# Patient Record
Sex: Male | Born: 1960 | Hispanic: Refuse to answer | Marital: Married | Smoking: Never smoker
Health system: Southern US, Community
[De-identification: ages and names within clinical notes are randomized; demographics above are authoritative.]

## PROBLEM LIST (undated history)

## (undated) DIAGNOSIS — J9819 Other pulmonary collapse: Secondary | ICD-10-CM

## (undated) DIAGNOSIS — I2699 Other pulmonary embolism without acute cor pulmonale: Secondary | ICD-10-CM

## (undated) DIAGNOSIS — F419 Anxiety disorder, unspecified: Secondary | ICD-10-CM

## (undated) DIAGNOSIS — I82409 Acute embolism and thrombosis of unspecified deep veins of unspecified lower extremity: Secondary | ICD-10-CM

## (undated) DIAGNOSIS — F329 Major depressive disorder, single episode, unspecified: Secondary | ICD-10-CM

## (undated) DIAGNOSIS — I10 Essential (primary) hypertension: Secondary | ICD-10-CM

## (undated) DIAGNOSIS — T148XXA Other injury of unspecified body region, initial encounter: Secondary | ICD-10-CM

## (undated) DIAGNOSIS — F32A Depression, unspecified: Secondary | ICD-10-CM

## (undated) HISTORY — PX: BLADDER SURGERY: SHX569

## (undated) HISTORY — PX: LUNG SURGERY: SHX703

## (undated) HISTORY — PX: COLON SURGERY: SHX602

## (undated) HISTORY — PX: BACK SURGERY: SHX140

---

## 2017-06-09 ENCOUNTER — Other Ambulatory Visit (HOSPITAL_BASED_OUTPATIENT_CLINIC_OR_DEPARTMENT_OTHER): Payer: Self-pay | Admitting: Physician Assistant

## 2017-06-09 DIAGNOSIS — M545 Low back pain: Secondary | ICD-10-CM

## 2017-06-10 ENCOUNTER — Ambulatory Visit (HOSPITAL_BASED_OUTPATIENT_CLINIC_OR_DEPARTMENT_OTHER): Payer: Commercial Managed Care - PPO

## 2017-06-17 ENCOUNTER — Ambulatory Visit (HOSPITAL_BASED_OUTPATIENT_CLINIC_OR_DEPARTMENT_OTHER): Payer: Commercial Managed Care - PPO

## 2017-07-31 ENCOUNTER — Other Ambulatory Visit: Payer: Self-pay | Admitting: Orthopedic Surgery

## 2017-07-31 DIAGNOSIS — M48062 Spinal stenosis, lumbar region with neurogenic claudication: Secondary | ICD-10-CM

## 2017-08-09 ENCOUNTER — Ambulatory Visit
Admission: RE | Admit: 2017-08-09 | Discharge: 2017-08-09 | Disposition: A | Discharge status: Home / Self Care | Payer: Commercial Managed Care - PPO | Source: Ambulatory Visit | Attending: Orthopedic Surgery | Admitting: Orthopedic Surgery

## 2017-08-09 ENCOUNTER — Other Ambulatory Visit: Payer: Self-pay | Admitting: Orthopedic Surgery

## 2017-08-09 ENCOUNTER — Ambulatory Visit
Admission: RE | Admit: 2017-08-09 | Discharge: 2017-08-09 | Disposition: A | Discharge status: Home / Self Care | Payer: Self-pay | Source: Ambulatory Visit | Attending: Orthopedic Surgery | Admitting: Orthopedic Surgery

## 2017-08-09 DIAGNOSIS — M48062 Spinal stenosis, lumbar region with neurogenic claudication: Secondary | ICD-10-CM

## 2017-08-09 MED ORDER — DIAZEPAM 5 MG PO TABS
10.0000 mg | ORAL_TABLET | Freq: Once | ORAL | Status: AC
  Administered 2017-08-09: 10 mg via ORAL

## 2017-08-09 MED ORDER — IOPAMIDOL (ISOVUE-M 200) INJECTION 41%
15.0000 mL | Freq: Once | INTRAMUSCULAR | Status: AC
  Administered 2017-08-09: 15 mL via INTRATHECAL

## 2017-08-09 MED ORDER — MEPERIDINE HCL 100 MG/ML IJ SOLN
75.0000 mg | Freq: Once | INTRAMUSCULAR | Status: AC
  Administered 2017-08-09: 75 mg via INTRAMUSCULAR

## 2017-08-09 MED ORDER — ONDANSETRON HCL 4 MG/2ML IJ SOLN: 4.0000 mg | Freq: Four times a day (QID) | INTRAMUSCULAR | Status: DC | PRN

## 2017-08-09 MED ORDER — ONDANSETRON HCL 4 MG/2ML IJ SOLN
4.0000 mg | Freq: Once | INTRAMUSCULAR | Status: AC
  Administered 2017-08-09: 4 mg via INTRAMUSCULAR

## 2017-08-09 NOTE — Discharge Instructions (Signed)

## 2017-08-30 ENCOUNTER — Other Ambulatory Visit (HOSPITAL_COMMUNITY): Payer: Self-pay | Admitting: *Deleted

## 2017-08-30 NOTE — Patient Instructions (Addendum)
[name] [name]  08/30/2017   Your procedure is scheduled on: Wednesday Dec. 26, 2018   [facility]  En[facility]   [place]  elevators to 3rd floor to  Short Stay Center at 1115  AM.    Call this number if you have problems the morning of surgery [phone]   Remember: ONLY 1 PERSON MAY GO WITH YOU TO SHORT STAY TO GET  READY MORNING OF YOUR SURGERY.   Do not eat food After Midnight.    CLEAR LIQUIDS FROM MIDNIGHT UNTIL 715 AM DAY OF SURGERY THEN NOTHING BY MOUTH (NO GUM, CANDY, OR MINTS) AFTER 715 AM DAY OF SURGERY.      CLEAR LIQUID DIET   Foods Allowed                                                                     Foods Excluded  Coffee and tea, regular and decaf                             liquids that you cannot  Plain Jell-O in any flavor                                             see through such as: Fruit ices (not with fruit pulp)                                     milk, soups, orange juice  Iced Popsicles                                    All solid food Carbonated beverages, regular and diet                                    Cranberry, grape and apple juices Sports drinks like Gatorade Lightly seasoned clear broth or consume(fat free) Sugar, honey syrup  Sample Menu Breakfast                                Lunch                                     Supper Cranberry juice                    Beef broth                            Chicken broth Jell-O  Grape juice                           Apple juice Coffee or tea                        Jell-O                                      Popsicle                                                Coffee or tea                        Coffee or tea   Take these medicines the morning of surgery with A SIP OF WATER: DIAZEPAM (VALIUM), GABAPENTIN (NEURONTIN), AMLODIPINE  (NORVASC), DEXAMETHASONE (DECADRON)              You may not have any metal on  your body including jewelry and piercings               Do not wear lotions, powders, perfumes, or deodorant                         Men may shave face and neck.   Do not bring valuables to the hospital. [facility] IS NOT             RESPONSIBLE   FOR VALUABLES.   Contacts, dentures or bridgework may not be worn into surgery.   Leave suitcase in the car. After surgery it may be brought to your room.              Please read over the following fact sheets you were given: _____________________________________________________________________ [facility] - Preparing for Surgery Before surgery, you can play an important role.  Because skin is not sterile, your skin needs to be as free of germs as possible.  You can reduce the number of germs on your skin by washing with CHG (chlorahexidine gluconate) soap before surgery.  CHG is an antiseptic cleaner which kills germs and bonds with the skin to continue killing germs even after washing. Please DO NOT use if you have an allergy to CHG or antibacterial soaps.  If your skin becomes reddened/irritated stop using the CHG and inform your nurse when you arrive at Short Stay. Do not shave (including legs and underarms) for at least 48 hours prior to the first CHG shower.  You may shave your face/neck.  Please follow these instructions carefully:  1.  Shower with CHG Soap the night before surgery and the  morning of surgery.  2.  If you choose to wash your hair, wash your hair first as usual with your normal  shampoo.  3.  After you shampoo, rinse your hair and body thoroughly to remove the shampoo.                             4.  Use CHG as you would any other liquid soap.  You can apply chg directly to the skin and wash.  Gently with a scrungie  or clean washcloth.  5.  Apply the CHG Soap to your body ONLY FROM THE NECK DOWN.   Do   not use on face/ open                           Wound or open sores. Avoid contact with eyes, ears mouth and   genitals  (private parts).                       Wash face,  Genitals (private parts) with your normal soap.             6.  Wash thoroughly, paying special attention to the area where your    surgery  will be performed.  7.  Thoroughly rinse your body with warm water from the neck down.  8.  DO NOT shower/wash with your normal soap after using and rinsing off the CHG Soap.                9.  Pat yourself dry with a clean towel.            10.  Wear clean pajamas.            11.  Place clean sheets on your bed the night of your first shower and do not  sleep with pets. Day of Surgery : Do not apply any lotions/deodorants the morning of surgery.  Please wear clean clothes to the hospital/surgery center.  FAILURE TO FOLLOW THESE INSTRUCTIONS MAY RESULT IN THE CANCELLATION OF YOUR SURGERY  PATIENT SIGNATURE_________________________________  NURSE SIGNATURE__________________________________  ________________________________________________________________________    [name] MireIncentive Spirometer  An incentive spirometer is a tool that can help keep your lungs clear and active. This tool measures how well you are filling your lungs with each breath. Taking long deep breaths may help reverse or decrease the chance of developing breathing (pulmonary) problems (especially infection) following:  A long period of time when you are unable to move or be active. BEFORE THE PROCEDURE   If the spirometer includes an indicator to show your best effort, your nurse or respiratory therapist will set it to a desired goal.  If possible, sit up straight or lean slightly forward. Try not to slouch.  Hold the incentive spirometer in an upright position. INSTRUCTIONS FOR USE  1. Sit on the edge of your bed if possible, or sit up as far as you can in bed or on a chair. 2. Hold the incentive spirometer in an upright position. 3. Breathe out normally. 4. Place the mouthpiece in your mouth and seal your lips tightly around  it. 5. Breathe in slowly and as deeply as possible, raising the piston or the ball toward the top of the column. 6. Hold your breath for 3-5 seconds or for as long as possible. Allow the piston or ball to fall to the bottom of the column. 7. Remove the mouthpiece from your mouth and breathe out normally. 8. Rest for a few seconds and repeat Steps 1 through 7 at least 10 times every 1-2 hours when you are awake. Take your time and take a few normal breaths between deep breaths. 9. The spirometer may include an indicator to show your best effort. Use the indicator as a goal to work toward during each repetition. 10. After each set of 10 deep breaths, practice coughing to be sure your lungs are clear. If you have an incision (the cut made  at the time of surgery), support your incision when coughing by placing a pillow or rolled up towels firmly against it. Once you are able to get out of bed, walk around indoors and cough well. You may stop using the incentive spirometer when instructed by your caregiver.  RISKS AND COMPLICATIONS  Take your time so you do not get dizzy or light-headed.  If you are in pain, you may need to take or ask for pain medication before doing incentive spirometry. It is harder to take a deep breath if you are having pain. AFTER USE  Rest and breathe slowly and easily.  It can be helpful to keep track of a log of your progress. Your caregiver can provide you with a simple table to help with this. If you are using the spirometer at home, follow these instructions: SEEK MEDICAL CARE IF:   You are having difficultly using the spirometer.  You have trouble using the spirometer as often as instructed.  Your pain medication is not giving enough relief while using the spirometer.  You develop fever of 100.5 F (38.1 C) or higher. SEEK IMMEDIATE MEDICAL CARE IF:   You cough up bloody sputum that had not been present before.  You develop fever of 102 F (38.9 C) or  greater.  You develop worsening pain at or near the incision site. MAKE SURE YOU:   Understand these instructions.  Will watch your condition.  Will get help right away if you are not doing well or get worse. Document Released: 01/09/2007 Document Revised: 11/21/2011 Document Reviewed: 03/12/2007 [facility] Patient Information 2014 [place], [place].   ________________________________________________________________________

## 2017-09-01 ENCOUNTER — Other Ambulatory Visit: Payer: Self-pay

## 2017-09-01 ENCOUNTER — Ambulatory Visit (HOSPITAL_COMMUNITY)
Admission: RE | Admit: 2017-09-01 | Discharge: 2017-09-01 | Disposition: A | Discharge status: Home / Self Care | Payer: Commercial Managed Care - PPO | Source: Ambulatory Visit | Attending: Surgical | Admitting: Surgical

## 2017-09-01 ENCOUNTER — Encounter (HOSPITAL_COMMUNITY): Payer: Self-pay

## 2017-09-01 ENCOUNTER — Encounter (HOSPITAL_COMMUNITY)
Admission: RE | Admit: 2017-09-01 | Discharge: 2017-09-01 | Disposition: A | Discharge status: Home / Self Care | Payer: Commercial Managed Care - PPO | Source: Ambulatory Visit | Attending: Orthopedic Surgery | Admitting: Orthopedic Surgery

## 2017-09-01 DIAGNOSIS — M48061 Spinal stenosis, lumbar region without neurogenic claudication: Secondary | ICD-10-CM | POA: Diagnosis not present

## 2017-09-01 DIAGNOSIS — M545 Low back pain, unspecified: Secondary | ICD-10-CM

## 2017-09-01 DIAGNOSIS — Z01818 Encounter for other preprocedural examination: Secondary | ICD-10-CM | POA: Insufficient documentation

## 2017-09-01 DIAGNOSIS — Z01812 Encounter for preprocedural laboratory examination: Secondary | ICD-10-CM | POA: Diagnosis present

## 2017-09-01 DIAGNOSIS — I517 Cardiomegaly: Secondary | ICD-10-CM | POA: Diagnosis not present

## 2017-09-01 DIAGNOSIS — Z01810 Encounter for preprocedural cardiovascular examination: Secondary | ICD-10-CM | POA: Diagnosis not present

## 2017-09-01 HISTORY — DX: Major depressive disorder, single episode, unspecified: F32.9

## 2017-09-01 HISTORY — DX: Other injury of unspecified body region, initial encounter: T14.8XXA

## 2017-09-01 HISTORY — DX: Other pulmonary collapse: J98.19

## 2017-09-01 HISTORY — DX: Depression, unspecified: F32.A

## 2017-09-01 HISTORY — DX: Acute embolism and thrombosis of unspecified deep veins of unspecified lower extremity: I82.409

## 2017-09-01 HISTORY — DX: Essential (primary) hypertension: I10

## 2017-09-01 HISTORY — DX: Anxiety disorder, unspecified: F41.9

## 2017-09-01 LAB — COMPREHENSIVE METABOLIC PANEL
ALT: 67 U/L — ABNORMAL HIGH (ref 17–63)
AST: 39 U/L (ref 15–41)
Albumin: 3.8 g/dL (ref 3.5–5.0)
Alkaline Phosphatase: 56 U/L (ref 38–126)
Anion gap: 9 (ref 5–15)
BUN: 27 mg/dL — ABNORMAL HIGH (ref 6–20)
CO2: 24 mmol/L (ref 22–32)
Calcium: 9.2 mg/dL (ref 8.9–10.3)
Chloride: 107 mmol/L (ref 101–111)
Creatinine, Ser: 0.97 mg/dL (ref 0.61–1.24)
GFR calc Af Amer: >60 mL/min (ref >60)
GFR calc non Af Amer: >60 mL/min (ref >60)
Glucose, Bld: 104 mg/dL — ABNORMAL HIGH (ref 65–99)
Potassium: 4.3 mmol/L (ref 3.5–5.1)
Sodium: 140 mmol/L (ref 135–145)
Total Bilirubin: 0.8 mg/dL (ref 0.3–1.2)
Total Protein: 7.4 g/dL (ref 6.5–8.1)

## 2017-09-01 LAB — CBC WITH DIFFERENTIAL/PLATELET
Basophils Absolute: 0 10*3/uL (ref 0.0–0.1)
Basophils Relative: 0 %
Eosinophils Absolute: 0 10*3/uL (ref 0.0–0.7)
Eosinophils Relative: 0 %
HCT: 39.2 % (ref 39.0–52.0)
Hemoglobin: 13.4 g/dL (ref 13.0–17.0)
Lymphocytes Relative: 11 %
Lymphs Abs: 0.9 10*3/uL (ref 0.7–4.0)
MCH: 30.1 pg (ref 26.0–34.0)
MCHC: 34.2 g/dL (ref 30.0–36.0)
MCV: 88.1 fL (ref 78.0–100.0)
Monocytes Absolute: 0.4 10*3/uL (ref 0.1–1.0)
Monocytes Relative: 5 %
Neutro Abs: 6.5 10*3/uL (ref 1.7–7.7)
Neutrophils Relative %: 84 %
Platelets: 208 10*3/uL (ref 150–400)
RBC: 4.45 MIL/uL (ref 4.22–5.81)
RDW: 15.6 % — ABNORMAL HIGH (ref 11.5–15.5)
WBC: 7.8 10*3/uL (ref 4.0–10.5)

## 2017-09-01 LAB — APTT: aPTT: 22 seconds — ABNORMAL LOW (ref 24–36)

## 2017-09-01 LAB — PROTIME-INR
INR: 0.98
Prothrombin Time: 12.9 seconds (ref 11.4–15.2)

## 2017-09-01 LAB — SURGICAL PCR SCREEN
MRSA, PCR: NEGATIVE
Staphylococcus aureus: NEGATIVE

## 2017-09-06 ENCOUNTER — Ambulatory Visit (HOSPITAL_COMMUNITY): Payer: Commercial Managed Care - PPO

## 2017-09-06 ENCOUNTER — Ambulatory Visit (HOSPITAL_COMMUNITY): Payer: Commercial Managed Care - PPO | Admitting: Certified Registered Nurse Anesthetist

## 2017-09-06 ENCOUNTER — Observation Stay (HOSPITAL_COMMUNITY)
Admission: RE | Admit: 2017-09-06 | Discharge: 2017-09-07 | Disposition: A | Discharge status: Home / Self Care | Payer: Commercial Managed Care - PPO | Source: Ambulatory Visit | Attending: Orthopedic Surgery | Admitting: Orthopedic Surgery

## 2017-09-06 ENCOUNTER — Encounter (HOSPITAL_COMMUNITY): Payer: Self-pay | Admitting: *Deleted

## 2017-09-06 ENCOUNTER — Other Ambulatory Visit: Payer: Self-pay

## 2017-09-06 ENCOUNTER — Encounter (HOSPITAL_COMMUNITY)
Admission: RE | Disposition: A | Discharge status: Home / Self Care | Payer: Self-pay | Source: Ambulatory Visit | Attending: Orthopedic Surgery

## 2017-09-06 DIAGNOSIS — F329 Major depressive disorder, single episode, unspecified: Secondary | ICD-10-CM | POA: Diagnosis not present

## 2017-09-06 DIAGNOSIS — M48061 Spinal stenosis, lumbar region without neurogenic claudication: Secondary | ICD-10-CM | POA: Diagnosis not present

## 2017-09-06 DIAGNOSIS — F419 Anxiety disorder, unspecified: Secondary | ICD-10-CM | POA: Diagnosis not present

## 2017-09-06 DIAGNOSIS — Z23 Encounter for immunization: Secondary | ICD-10-CM | POA: Insufficient documentation

## 2017-09-06 DIAGNOSIS — M549 Dorsalgia, unspecified: Secondary | ICD-10-CM

## 2017-09-06 DIAGNOSIS — M5126 Other intervertebral disc displacement, lumbar region: Secondary | ICD-10-CM | POA: Diagnosis not present

## 2017-09-06 DIAGNOSIS — I10 Essential (primary) hypertension: Secondary | ICD-10-CM | POA: Insufficient documentation

## 2017-09-06 DIAGNOSIS — G8929 Other chronic pain: Secondary | ICD-10-CM

## 2017-09-06 DIAGNOSIS — Z79899 Other long term (current) drug therapy: Secondary | ICD-10-CM | POA: Insufficient documentation

## 2017-09-06 DIAGNOSIS — Z419 Encounter for procedure for purposes other than remedying health state, unspecified: Secondary | ICD-10-CM

## 2017-09-06 DIAGNOSIS — M961 Postlaminectomy syndrome, not elsewhere classified: Secondary | ICD-10-CM | POA: Diagnosis present

## 2017-09-06 HISTORY — PX: LUMBAR LAMINECTOMY/DECOMPRESSION MICRODISCECTOMY: SHX5026

## 2017-09-06 SURGERY — LUMBAR LAMINECTOMY/DECOMPRESSION MICRODISCECTOMY
Anesthesia: General | Site: Back

## 2017-09-06 MED ORDER — OXYCODONE-ACETAMINOPHEN 5-325 MG PO TABS
2.0000 | ORAL_TABLET | ORAL | Status: DC | PRN
  Administered 2017-09-06 – 2017-09-07 (×3): 2 via ORAL
  Filled 2017-09-06 (×3): qty 2

## 2017-09-06 MED ORDER — ONDANSETRON HCL 4 MG PO TABS: 4.0000 mg | ORAL_TABLET | Freq: Four times a day (QID) | ORAL | Status: DC | PRN

## 2017-09-06 MED ORDER — FENTANYL CITRATE (PF) 250 MCG/5ML IJ SOLN
INTRAMUSCULAR | Status: AC
  Filled 2017-09-06: qty 5

## 2017-09-06 MED ORDER — INFLUENZA VAC SPLIT QUAD 0.5 ML IM SUSY
0.5000 mL | PREFILLED_SYRINGE | INTRAMUSCULAR | Status: AC
  Administered 2017-09-07: 0.5 mL via INTRAMUSCULAR

## 2017-09-06 MED ORDER — ACETAMINOPHEN 325 MG PO TABS: 650.0000 mg | ORAL_TABLET | ORAL | Status: DC | PRN

## 2017-09-06 MED ORDER — METHOCARBAMOL 1000 MG/10ML IJ SOLN
500.0000 mg | Freq: Four times a day (QID) | INTRAVENOUS | Status: DC | PRN
  Filled 2017-09-06: qty 5

## 2017-09-06 MED ORDER — PROPOFOL 10 MG/ML IV BOLUS
INTRAVENOUS | Status: DC | PRN
  Administered 2017-09-06: 150 mg via INTRAVENOUS

## 2017-09-06 MED ORDER — LACTATED RINGERS IV SOLN
INTRAVENOUS | Status: DC
  Administered 2017-09-06: 1000 mL via INTRAVENOUS

## 2017-09-06 MED ORDER — FENTANYL CITRATE (PF) 100 MCG/2ML IJ SOLN
INTRAMUSCULAR | Status: DC | PRN
  Administered 2017-09-06 (×5): 50 ug via INTRAVENOUS

## 2017-09-06 MED ORDER — POLYMYXIN B SULFATE 500000 UNITS IJ SOLR
INTRAMUSCULAR | Status: DC | PRN
  Administered 2017-09-06: 500 mL

## 2017-09-06 MED ORDER — MIDAZOLAM HCL 2 MG/2ML IJ SOLN
INTRAMUSCULAR | Status: AC
  Filled 2017-09-06: qty 2

## 2017-09-06 MED ORDER — FENTANYL CITRATE (PF) 100 MCG/2ML IJ SOLN
25.0000 ug | INTRAMUSCULAR | Status: DC | PRN
  Administered 2017-09-06 (×3): 50 ug via INTRAVENOUS

## 2017-09-06 MED ORDER — DEXAMETHASONE SODIUM PHOSPHATE 10 MG/ML IJ SOLN
INTRAMUSCULAR | Status: AC
  Filled 2017-09-06: qty 1

## 2017-09-06 MED ORDER — MIDAZOLAM HCL 5 MG/5ML IJ SOLN
INTRAMUSCULAR | Status: DC | PRN
  Administered 2017-09-06: 2 mg via INTRAVENOUS

## 2017-09-06 MED ORDER — OXYCODONE HCL 5 MG/5ML PO SOLN
5.0000 mg | Freq: Once | ORAL | Status: DC | PRN
Start: 2017-09-06 — End: 2017-09-06

## 2017-09-06 MED ORDER — CHLORHEXIDINE GLUCONATE 4 % EX LIQD: 60.0000 mL | Freq: Once | CUTANEOUS | Status: DC

## 2017-09-06 MED ORDER — BUPIVACAINE-EPINEPHRINE 0.5% -1:200000 IJ SOLN
INTRAMUSCULAR | Status: DC | PRN
  Administered 2017-09-06: 20 mL

## 2017-09-06 MED ORDER — HEMOSTATIC AGENTS (NO CHARGE) OPTIME
TOPICAL | Status: DC | PRN
  Administered 2017-09-06: 1 via TOPICAL

## 2017-09-06 MED ORDER — ONDANSETRON HCL 4 MG/2ML IJ SOLN
INTRAMUSCULAR | Status: DC | PRN
  Administered 2017-09-06: 4 mg via INTRAVENOUS

## 2017-09-06 MED ORDER — CEFAZOLIN SODIUM-DEXTROSE 2-4 GM/100ML-% IV SOLN
2.0000 g | INTRAVENOUS | Status: AC
  Administered 2017-09-06: 2 g via INTRAVENOUS
  Filled 2017-09-06: qty 100

## 2017-09-06 MED ORDER — BUPIVACAINE LIPOSOME 1.3 % IJ SUSP
INTRAMUSCULAR | Status: DC | PRN
  Administered 2017-09-06: 20 mL

## 2017-09-06 MED ORDER — GABAPENTIN 300 MG PO CAPS
300.0000 mg | ORAL_CAPSULE | Freq: Three times a day (TID) | ORAL | Status: DC
  Administered 2017-09-06: 300 mg via ORAL
  Filled 2017-09-06 (×2): qty 1

## 2017-09-06 MED ORDER — BUPIVACAINE-EPINEPHRINE (PF) 0.5% -1:200000 IJ SOLN
INTRAMUSCULAR | Status: AC
  Filled 2017-09-06: qty 30

## 2017-09-06 MED ORDER — ACETAMINOPHEN 650 MG RE SUPP: 650.0000 mg | RECTAL | Status: DC | PRN

## 2017-09-06 MED ORDER — FLEET ENEMA 7-19 GM/118ML RE ENEM: 1.0000 | ENEMA | Freq: Once | RECTAL | Status: DC | PRN

## 2017-09-06 MED ORDER — OXYCODONE HCL 5 MG PO TABS: 5.0000 mg | ORAL_TABLET | Freq: Once | ORAL | Status: DC | PRN

## 2017-09-06 MED ORDER — FENTANYL CITRATE (PF) 100 MCG/2ML IJ SOLN
INTRAMUSCULAR | Status: AC
  Filled 2017-09-06: qty 2

## 2017-09-06 MED ORDER — LIDOCAINE 2% (20 MG/ML) 5 ML SYRINGE
INTRAMUSCULAR | Status: AC
  Filled 2017-09-06: qty 5

## 2017-09-06 MED ORDER — ONDANSETRON HCL 4 MG/2ML IJ SOLN
INTRAMUSCULAR | Status: AC
  Filled 2017-09-06: qty 2

## 2017-09-06 MED ORDER — AMLODIPINE BESYLATE 10 MG PO TABS
10.0000 mg | ORAL_TABLET | Freq: Every day | ORAL | Status: DC
  Administered 2017-09-07: 10 mg via ORAL
  Filled 2017-09-06: qty 1

## 2017-09-06 MED ORDER — ONDANSETRON HCL 4 MG/2ML IJ SOLN: 4.0000 mg | Freq: Four times a day (QID) | INTRAMUSCULAR | Status: DC | PRN

## 2017-09-06 MED ORDER — ZOLPIDEM TARTRATE 10 MG PO TABS
10.0000 mg | ORAL_TABLET | Freq: Every evening | ORAL | Status: DC | PRN
  Administered 2017-09-06: 10 mg via ORAL
  Filled 2017-09-06: qty 1

## 2017-09-06 MED ORDER — BACITRACIN-NEOMYCIN-POLYMYXIN 400-5-5000 EX OINT
TOPICAL_OINTMENT | CUTANEOUS | Status: AC
  Filled 2017-09-06: qty 1

## 2017-09-06 MED ORDER — THROMBIN (RECOMBINANT) 5000 UNITS EX SOLR
OROMUCOSAL | Status: DC | PRN
  Administered 2017-09-06: 5 mL via TOPICAL

## 2017-09-06 MED ORDER — PROPOFOL 10 MG/ML IV BOLUS
INTRAVENOUS | Status: AC
  Filled 2017-09-06: qty 20

## 2017-09-06 MED ORDER — SUGAMMADEX SODIUM 200 MG/2ML IV SOLN
INTRAVENOUS | Status: AC
  Filled 2017-09-06: qty 2

## 2017-09-06 MED ORDER — HYDROCODONE-ACETAMINOPHEN 5-325 MG PO TABS
1.0000 | ORAL_TABLET | ORAL | Status: DC | PRN
  Administered 2017-09-07: 1 via ORAL
  Filled 2017-09-06: qty 1

## 2017-09-06 MED ORDER — ROCURONIUM BROMIDE 50 MG/5ML IV SOSY
PREFILLED_SYRINGE | INTRAVENOUS | Status: AC
  Filled 2017-09-06: qty 5

## 2017-09-06 MED ORDER — CEFAZOLIN SODIUM-DEXTROSE 1-4 GM/50ML-% IV SOLN
1.0000 g | Freq: Three times a day (TID) | INTRAVENOUS | Status: DC
  Administered 2017-09-06 – 2017-09-07 (×2): 1 g via INTRAVENOUS
  Filled 2017-09-06 (×3): qty 50

## 2017-09-06 MED ORDER — METHOCARBAMOL 500 MG PO TABS
500.0000 mg | ORAL_TABLET | Freq: Four times a day (QID) | ORAL | Status: DC | PRN
  Administered 2017-09-06 – 2017-09-07 (×2): 500 mg via ORAL
  Filled 2017-09-06 (×2): qty 1

## 2017-09-06 MED ORDER — BISACODYL 5 MG PO TBEC: 5.0000 mg | DELAYED_RELEASE_TABLET | Freq: Every day | ORAL | Status: DC | PRN

## 2017-09-06 MED ORDER — BACITRACIN-NEOMYCIN-POLYMYXIN 400-5-5000 EX OINT
TOPICAL_OINTMENT | CUTANEOUS | Status: DC | PRN
  Administered 2017-09-06: 1 via TOPICAL

## 2017-09-06 MED ORDER — MENTHOL 3 MG MT LOZG: 1.0000 | LOZENGE | OROMUCOSAL | Status: DC | PRN

## 2017-09-06 MED ORDER — POLYETHYLENE GLYCOL 3350 17 G PO PACK: 17.0000 g | PACK | Freq: Every day | ORAL | Status: DC | PRN

## 2017-09-06 MED ORDER — PHENOL 1.4 % MT LIQD: 1.0000 | OROMUCOSAL | Status: DC | PRN

## 2017-09-06 MED ORDER — POLYMYXIN B SULFATE 500000 UNITS IJ SOLR
INTRAMUSCULAR | Status: AC
  Filled 2017-09-06: qty 500000

## 2017-09-06 MED ORDER — HYDROMORPHONE HCL 1 MG/ML IJ SOLN
0.5000 mg | INTRAMUSCULAR | Status: DC | PRN
  Administered 2017-09-06 – 2017-09-07 (×3): 0.5 mg via INTRAVENOUS
  Filled 2017-09-06 (×3): qty 0.5

## 2017-09-06 MED ORDER — OXYCODONE-ACETAMINOPHEN 5-325 MG PO TABS: 2.0000 | ORAL_TABLET | ORAL | Status: DC | PRN

## 2017-09-06 MED ORDER — BUPIVACAINE LIPOSOME 1.3 % IJ SUSP
20.0000 mL | Freq: Once | INTRAMUSCULAR | Status: DC
  Filled 2017-09-06: qty 20

## 2017-09-06 MED ORDER — LIDOCAINE 2% (20 MG/ML) 5 ML SYRINGE
INTRAMUSCULAR | Status: DC | PRN
  Administered 2017-09-06: 100 mg via INTRAVENOUS

## 2017-09-06 MED ORDER — DEXAMETHASONE SODIUM PHOSPHATE 4 MG/ML IJ SOLN
INTRAMUSCULAR | Status: DC | PRN
  Administered 2017-09-06: 10 mg via INTRAVENOUS

## 2017-09-06 MED ORDER — LACTATED RINGERS IV SOLN
INTRAVENOUS | Status: DC
  Administered 2017-09-06 (×2): via INTRAVENOUS

## 2017-09-06 MED ORDER — ROCURONIUM BROMIDE 50 MG/5ML IV SOSY
PREFILLED_SYRINGE | INTRAVENOUS | Status: DC | PRN
  Administered 2017-09-06 (×3): 10 mg via INTRAVENOUS
  Administered 2017-09-06: 50 mg via INTRAVENOUS

## 2017-09-06 MED ORDER — SUGAMMADEX SODIUM 200 MG/2ML IV SOLN
INTRAVENOUS | Status: DC | PRN
  Administered 2017-09-06: 200 mg via INTRAVENOUS

## 2017-09-06 MED ORDER — THROMBIN (RECOMBINANT) 5000 UNITS EX SOLR
CUTANEOUS | Status: AC
  Filled 2017-09-06: qty 10000

## 2017-09-06 SURGICAL SUPPLY — 50 items
BAG ZIPLOCK 12X15 (MISCELLANEOUS) IMPLANT
BENZOIN TINCTURE PRP APPL 2/3 (GAUZE/BANDAGES/DRESSINGS) ×3 IMPLANT
CLEANER TIP ELECTROSURG 2X2 (MISCELLANEOUS) ×3 IMPLANT
COVER SURGICAL LIGHT HANDLE (MISCELLANEOUS) ×3 IMPLANT
DRAIN PENROSE 18X1/4 LTX STRL (WOUND CARE) IMPLANT
DRAPE MICROSCOPE LEICA (MISCELLANEOUS) ×3 IMPLANT
DRAPE POUCH INSTRU U-SHP 10X18 (DRAPES) ×3 IMPLANT
DRAPE SHEET LG 3/4 BI-LAMINATE (DRAPES) ×3 IMPLANT
DRAPE SURG 17X11 SM STRL (DRAPES) ×3 IMPLANT
DRSG ADAPTIC 3X8 NADH LF (GAUZE/BANDAGES/DRESSINGS) ×3 IMPLANT
DURAPREP 26ML APPLICATOR (WOUND CARE) ×3 IMPLANT
ELECT BLADE TIP CTD 4 INCH (ELECTRODE) ×3 IMPLANT
ELECT REM PT RETURN 15FT ADLT (MISCELLANEOUS) ×3 IMPLANT
GAUZE SPONGE 4X4 12PLY STRL (GAUZE/BANDAGES/DRESSINGS) ×3 IMPLANT
GLOVE BIOGEL PI IND STRL 6.5 (GLOVE) ×1 IMPLANT
GLOVE BIOGEL PI IND STRL 7.5 (GLOVE) ×2 IMPLANT
GLOVE BIOGEL PI IND STRL 8.5 (GLOVE) ×1 IMPLANT
GLOVE BIOGEL PI INDICATOR 6.5 (GLOVE) ×2
GLOVE BIOGEL PI INDICATOR 7.5 (GLOVE) ×4
GLOVE BIOGEL PI INDICATOR 8.5 (GLOVE) ×2
GLOVE ECLIPSE 8.0 STRL XLNG CF (GLOVE) ×6 IMPLANT
GLOVE SURG SS PI 6.5 STRL IVOR (GLOVE) ×3 IMPLANT
GLOVE SURG SS PI 7.5 STRL IVOR (GLOVE) ×3 IMPLANT
GOWN STRL REUS W/ TWL LRG LVL3 (GOWN DISPOSABLE) ×1 IMPLANT
GOWN STRL REUS W/ TWL XL LVL3 (GOWN DISPOSABLE) ×1 IMPLANT
GOWN STRL REUS W/TWL LRG LVL3 (GOWN DISPOSABLE) ×2
GOWN STRL REUS W/TWL XL LVL3 (GOWN DISPOSABLE) ×5 IMPLANT
HEMOSTAT SPONGE AVITENE ULTRA (HEMOSTASIS) ×3 IMPLANT
KIT BASIN OR (CUSTOM PROCEDURE TRAY) ×3 IMPLANT
KIT POSITIONING SURG ANDREWS (MISCELLANEOUS) ×3 IMPLANT
MANIFOLD NEPTUNE II (INSTRUMENTS) ×3 IMPLANT
MARKER SKIN DUAL TIP RULER LAB (MISCELLANEOUS) ×3 IMPLANT
NEEDLE HYPO 22GX1.5 SAFETY (NEEDLE) ×6 IMPLANT
NEEDLE SPNL 18GX3.5 QUINCKE PK (NEEDLE) ×6 IMPLANT
PACK LAMINECTOMY ORTHO (CUSTOM PROCEDURE TRAY) ×3 IMPLANT
PAD ABD 8X10 STRL (GAUZE/BANDAGES/DRESSINGS) ×6 IMPLANT
PATTIES SURGICAL .5 X.5 (GAUZE/BANDAGES/DRESSINGS) ×3 IMPLANT
PATTIES SURGICAL .75X.75 (GAUZE/BANDAGES/DRESSINGS) ×3 IMPLANT
PATTIES SURGICAL 1X1 (DISPOSABLE) IMPLANT
PIN SAFETY NICK PLATE  2 MED (MISCELLANEOUS)
PIN SAFETY NICK PLATE 2 MED (MISCELLANEOUS) IMPLANT
SPONGE LAP 4X18 X RAY DECT (DISPOSABLE) ×6 IMPLANT
STAPLER VISISTAT 35W (STAPLE) ×3 IMPLANT
SUT VIC AB 1 CT1 27 (SUTURE) ×4
SUT VIC AB 1 CT1 27XBRD ANTBC (SUTURE) ×2 IMPLANT
SUT VIC AB 2-0 CT1 27 (SUTURE) ×4
SUT VIC AB 2-0 CT1 TAPERPNT 27 (SUTURE) ×2 IMPLANT
SYR 20CC LL (SYRINGE) ×6 IMPLANT
TAPE CLOTH SURG 4X10 WHT LF (GAUZE/BANDAGES/DRESSINGS) ×3 IMPLANT
TOWEL OR 17X26 10 PK STRL BLUE (TOWEL DISPOSABLE) ×3 IMPLANT

## 2017-09-06 NOTE — Brief Op Note (Signed)
09/06/2017  3:47 PM  PATIENT:  [name] [name]  56 y.o. male  PRE-OPERATIVE DIAGNOSIS:  Spinal stenosisL-3-L-4 and HNP at same level on the right  POST-OPERATIVE DIAGNOSIS: Same as Pre-Op  PROCEDURE:  Procedure(s): Decompressive lumbar laminiectomy for spinal stenosis at L3-L4, microdiscectomy at L3 and L4 of right, and foraminotomy for L3 and L4 root on right for foraminal stenosis (N/A)  SURGEON:  Surgeon(s) and Role:    Ranee Gosselin* Rykker Coviello, MD - Primary  PHYSICIAN ASSISTANT: Dimitri PedAmber Constable PA  ASSISTANTS:Amber Boiseonstable PA  ANESTHESIA:   general  EBL:  100 mL   BLOOD ADMINISTERED:none  DRAINS: none   LOCAL MEDICATIONS USED:  MARCAINE 20cc of 0.50% with Epinephrine at the start of the case and 20cc of Exparel at the end of the case.    SPECIMEN:  Source of Specimen:  L-3-L-4 on the right.  DISPOSITION OF SPECIMEN:  PATHOLOGY  COUNTS:  YES  TOURNIQUET:  * No tourniquets in log *  DICTATION: .Other Dictation: Dictation Number [id number]  PLAN OF CARE: Admit for overnight observation  PATIENT DISPOSITION:  PACU - hemodynamically stable.   Delay start of Pharmacological VTE agent (>24hrs) due to surgical blood loss or risk of bleeding: yes

## 2017-09-06 NOTE — Progress Notes (Signed)
Patient has not arrived for surgery. Writer called his number. He states he does not need to be here until 1:15PM . RN explains that he signed a pre op instruction to be here at 11:15 AM for 1:15 PM surgery. He realizes his error and will get here as soon as possible. Notified surgery desk.

## 2017-09-06 NOTE — Anesthesia Procedure Notes (Signed)
Procedure Name: Intubation Date/Time: 09/06/2017 1:20 PM Performed by: Deliah Boston, CRNA Pre-anesthesia Checklist: Patient identified, Emergency Drugs available, Suction available and Patient being monitored Patient Re-evaluated:Patient Re-evaluated prior to induction Oxygen Delivery Method: Circle system utilized Preoxygenation: Pre-oxygenation with 100% oxygen Induction Type: IV induction Ventilation: Mask ventilation without difficulty Laryngoscope Size: Mac and 4 Grade View: Grade I Tube type: Oral Tube size: 7.5 mm Number of attempts: 1 Airway Equipment and Method: Stylet Placement Confirmation: ETT inserted through vocal cords under direct vision,  positive ETCO2 and breath sounds checked- equal and bilateral Secured at: 22 cm Tube secured with: Tape Dental Injury: Teeth and Oropharynx as per pre-operative assessment
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
## 2017-09-06 NOTE — Anesthesia Preprocedure Evaluation (Signed)
Anesthesia Evaluation  Patient identified by MRN, date of birth, ID band Patient awake    Reviewed: Allergy & Precautions, NPO status , Patient's Chart, lab work & pertinent test results  History of Anesthesia Complications Negative for: history of anesthetic complications  Airway Mallampati: III  TM Distance: >3 FB Neck ROM: Full    Dental  (+) Poor Dentition, Missing   Pulmonary neg pulmonary ROS,    breath sounds clear to auscultation       Cardiovascular hypertension, Pt. on medications  Rhythm:Regular     Neuro/Psych PSYCHIATRIC DISORDERS Anxiety Depression  Neuromuscular disease    GI/Hepatic negative GI ROS, Neg liver ROS,   Endo/Other  negative endocrine ROS  Renal/GU negative Renal ROS     Musculoskeletal   Abdominal   Peds  Hematology negative hematology ROS (+)   Anesthesia Other Findings   Reproductive/Obstetrics                             Anesthesia Physical Anesthesia Plan  ASA: II  Anesthesia Plan: General   Post-op Pain Management:    Induction: Intravenous  PONV Risk Score and Plan: 2 and Ondansetron and Dexamethasone  Airway Management Planned: Oral ETT  Additional Equipment: None  Intra-op Plan:   Post-operative Plan: Extubation in OR  Informed Consent: I have reviewed the patients History and Physical, chart, labs and discussed the procedure including the risks, benefits and alternatives for the proposed anesthesia with the patient or authorized representative who has indicated his/her understanding and acceptance.   Dental advisory given  Plan Discussed with: CRNA and Surgeon  Anesthesia Plan Comments:         Anesthesia Quick Evaluation

## 2017-09-06 NOTE — Transfer of Care (Signed)
Immediate Anesthesia Transfer of Care Note  Patient: [name] [name]  Procedure(s) Performed: Decompressive lumbar laminiectomy for spinal stenosis at L3-L4, microdiscectomy at L3 and L4 of right, and foraminotomy for L3 and L4 root on right for foraminal stenosis (N/A Back)  Patient Location: PACU  Anesthesia Type:General  Level of Consciousness: sedated and responds to stimulation  Airway & Oxygen Therapy: Patient Spontanous Breathing and Patient connected to face mask oxygen  Post-op Assessment: Report given to RN and Post -op Vital signs reviewed and stable  Post vital signs: Reviewed and stable  Last Vitals:  Vitals:   09/06/17 1228 09/06/17 1547  BP: (!) 164/96 (!) 153/94  Pulse: 72 78  Resp: 18   Temp: 36.9 C   SpO2: 96% 96%    Last Pain:  Vitals:   09/06/17 1228  TempSrc: Oral  PainSc: 8          Complications: No apparent anesthesia complications

## 2017-09-06 NOTE — Interval H&P Note (Signed)
History and Physical Interval Note:  09/06/2017 12:55 PM  [name] [name]  has presented today for surgery, with the diagnosis of Spinal stenosis L4-L4  The various methods of treatment have been discussed with the patient and family. After consideration of risks, benefits and other options for treatment, the patient has consented to  Procedure(s): Decompressive lumbar laminiectomy at L3-L4-note labeling (N/A) as a surgical intervention .  The patient's history has been reviewed, patient examined, no change in status, stable for surgery.  I have reviewed the patient's chart and labs.  Questions were answered to the patient's satisfaction.     Ranee Gosselinonald Lenise Jr

## 2017-09-07 ENCOUNTER — Encounter (HOSPITAL_COMMUNITY): Payer: Self-pay | Admitting: Orthopedic Surgery

## 2017-09-07 DIAGNOSIS — M48061 Spinal stenosis, lumbar region without neurogenic claudication: Secondary | ICD-10-CM | POA: Diagnosis not present

## 2017-09-07 MED ORDER — METHOCARBAMOL 500 MG PO TABS: 500.0000 mg | ORAL_TABLET | Freq: Four times a day (QID) | ORAL | 1 refills | Status: DC | PRN

## 2017-09-07 MED ORDER — ZOLPIDEM TARTRATE 10 MG PO TABS: 10.0000 mg | ORAL_TABLET | Freq: Every evening | ORAL | 0 refills | Status: AC | PRN

## 2017-09-07 MED ORDER — OXYCODONE-ACETAMINOPHEN 5-325 MG PO TABS
1.0000 | ORAL_TABLET | ORAL | 0 refills | Status: AC | PRN
Start: 2017-09-07 — End: ?

## 2017-09-07 NOTE — Discharge Instructions (Signed)
For the first three days, remove your dressing, and tape a piece of saran wrap over your incision. °Take your shower, then remove the saran wrap and put a clean dressing on. °After three days you can shower without the saran wrap.  °No lifting or excessive bending °No driving while taking pain medications °Call Dr. Gioffre if any wound complications or temperature of 101 degrees F or over.  °Call the office for an appointment to see Dr. Gioffre in two weeks: [phone] and ask for Dr. Gioffre's medical assistant, Tammy Johnson.  °

## 2017-09-07 NOTE — Evaluation (Signed)
Physical Therapy Evaluation Patient Details Name: [name] HurstWillie J [name] MRN: [id number] DOB: Jul 22, 1961 Today's Date: 09/07/2017   History of Present Illness  s/p decompressive lumbar laminectomy L3-L4, microdiscectomy L3 and 4, foraminotomy L3-L4 root  Clinical Impression  Pt s/p back surgery and presents with functional mobility limitations 2* post op pain and back precautions.  Pt currently mobilizing at sup - MOD I level and planning dc home with family assist.    Follow Up Recommendations No PT follow up    Equipment Recommendations  Rolling walker with 5" wheels    Recommendations for Other Services       Precautions / Restrictions Precautions Precautions: Back Precaution Booklet Issued: Yes (comment) Restrictions Weight Bearing Restrictions: No      Mobility  Bed Mobility Overal bed mobility: Needs Assistance Bed Mobility: Supine to Sit;Sit to Supine     Supine to sit: Supervision Sit to supine: Supervision   General bed mobility comments: Clarified log roll technique and pt demonstrated ability to perform log roll technique x 2  Transfers Overall transfer level: Modified independent Equipment used: None             General transfer comment: cues for technique to avoid bending  Ambulation/Gait Ambulation/Gait assistance: Modified independent (Device/Increase time);Min guard Ambulation Distance (Feet): 500 Feet Assistive device: Rolling walker (2 wheeled) Gait Pattern/deviations: Step-through pattern;Decreased step length - right;Decreased step length - left;Shuffle Gait velocity: decr Gait velocity interpretation: Below normal speed for age/gender General Gait Details: cues for posture and position from RW  Stairs Stairs: Yes Stairs assistance: Min guard Stair Management: Two rails;Step to pattern;Forwards Number of Stairs: 3    Wheelchair Mobility    Modified Rankin (Stroke Patients Only)       Balance Overall balance assessment: Needs  assistance Sitting-balance support: Feet supported;No upper extremity supported Sitting balance-Leahy Scale: Good     Standing balance support: No upper extremity supported Standing balance-Leahy Scale: Fair Standing balance comment: Pt very tentative to move without UE support                             Pertinent Vitals/Pain Pain Assessment: 0-10 Pain Score: 6  Pain Location: back incision Pain Descriptors / Indicators: Aching;Sore Pain Intervention(s): Limited activity within patient's tolerance;Monitored during session;Premedicated before session    Home Living Family/patient expects to be discharged to:: Private residence Living Arrangements: Spouse/significant other Available Help at Discharge: Family;Available 24 hours/day Type of Home: House Home Access: Ramped entrance     Home Layout: One level Home Equipment: Grab bars - tub/shower      Prior Function Level of Independence: Independent         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, Theatre stage managerdrives     Hand Dominance   Dominant Hand: Right    Extremity/Trunk Assessment   Upper Extremity Assessment Upper Extremity Assessment: Overall WFL for tasks assessed    Lower Extremity Assessment Lower Extremity Assessment: Overall WFL for tasks assessed    Cervical / Trunk Assessment Cervical / Trunk Assessment: Other exceptions Cervical / Trunk Exceptions: s/p back sx  Communication   Communication: No difficulties  Cognition Arousal/Alertness: Awake/alert Behavior During Therapy: WFL for tasks assessed/performed Overall Cognitive Status: Within Functional Limits for tasks assessed                                        General  Comments      Exercises     Assessment/Plan    PT Assessment Patient needs continued PT services  PT Problem List Decreased activity tolerance;Decreased balance;Decreased mobility;Decreased knowledge of use of DME;Decreased knowledge of precautions;Pain        PT Treatment Interventions      PT Goals (Current goals can be found in the Care Plan section)  Acute Rehab PT Goals Patient Stated Goal: Regain IND and get back to working on his cars PT Goal Formulation: All assessment and education complete, DC therapy    Frequency Min 1X/week   Barriers to discharge        Co-evaluation               AM-PAC PT "6 Clicks" Daily Activity  Outcome Measure Difficulty turning over in bed (including adjusting bedclothes, sheets and blankets)?: A Little Difficulty moving from lying on back to sitting on the side of the bed? : A Little Difficulty sitting down on and standing up from a chair with arms (e.g., wheelchair, bedside commode, etc,.)?: A Little Help needed moving to and from a bed to chair (including a wheelchair)?: None Help needed walking in hospital room?: None Help needed climbing 3-5 steps with a railing? : A Little 6 Click Score: 20    End of Session   Activity Tolerance: Patient tolerated treatment well Patient left: in bed;with call bell/phone within reach Nurse Communication: Mobility status PT Visit Diagnosis: Difficulty in walking, not elsewhere classified (R26.2)    Time: [id number] PT Time Calculation (min) (ACUTE ONLY): 22 min   Charges:   PT Evaluation $PT Eval Low Complexity: 1 Low     PT G Codes:        Pg [phone]   [name] [name] 09/07/2017, 10:49 AM

## 2017-09-07 NOTE — Progress Notes (Signed)
Subjective: 1 Day Post-Op Procedure(s) (LRB): Decompressive lumbar laminiectomy for spinal stenosis at L3-L4, microdiscectomy at L3 and L4 of right, and foraminotomy for L3 and L4 root on right for foraminal stenosis (N/A) Patient reports pain as 1 on 0-10 scale.  Doing very well. Now has normal Strength in both feet.Will DC today.  Objective: Vital signs in last 24 hours: Temp:  [97.9 F (36.6 C)-98.6 F (37 C)] 98.3 F (36.8 C) (12/27 0620) Pulse Rate:  [65-81] 67 (12/27 0620) Resp:  [11-18] 14 (12/27 0620) BP: (146-172)/(74-105) 172/102 (12/27 0620) SpO2:  [93 %-99 %] 96 % (12/27 0620) Weight:  [105.2 kg (232 lb)] 105.2 kg (232 lb) (12/26 1200)  Intake/Output from previous day: 12/26 0701 - 12/27 0700 In: 3200 [P.O.:600; I.V.:2500; IV Piggyback:100] Out: 2200 [Urine:2100; Blood:100] Intake/Output this shift: No intake/output data recorded.  No results for input(s): HGB in the last 72 hours. No results for input(s): WBC, RBC, HCT, PLT in the last 72 hours. No results for input(s): NA, K, CL, CO2, BUN, CREATININE, GLUCOSE, CALCIUM in the last 72 hours. No results for input(s): LABPT, INR in the last 72 hours.  Dorsiflexion/Plantar flexion intact  Assessment/Plan: 1 Day Post-Op Procedure(s) (LRB): Decompressive lumbar laminiectomy for spinal stenosis at L3-L4, microdiscectomy at L3 and L4 of right, and foraminotomy for L3 and L4 root on right for foraminal stenosis (N/A) Up with therapy.Will DC today.  Ranee Gosselinonald Javonne Louissaint 09/07/2017, 7:30 AM

## 2017-09-07 NOTE — Op Note (Signed)
[name] Salon:  [name], [name]              ACCOUNT NO.:  [id number]  MEDICAL RECORD NO.:  [id number]  LOCATION:                                 FACILITY:  PHYSICIAN:  Georges Lynchonald A. [place], M.D.DATE OF BIRTH:  03/13/1961  DATE OF PROCEDURE:  09/06/2017 DATE OF DISCHARGE:                              OPERATIVE REPORT   SURGEON:  Georges Lynchonald A. Darrelyn HillockGioffre, M.D.  OPERATIVE ASSISTANT:  Dimitri PedAmber Constable, PA  PREOPERATIVE DIAGNOSES: 1. Spinal stenosis with a severe constriction at L3-L4. 2. Herniated disk, L3-4, on the right. 3. Foraminal stenosis involving the L3 root on the right. 4. Foraminal stenosis involving the L4 root on the right.  POSTOPERATIVE DIAGNOSES: 1. Spinal stenosis with a severe constriction at L3-L4. 2. Herniated disk, L3-4, on the right. 3. Foraminal stenosis involving the L3 root on the right. 4. Foraminal stenosis involving the L4 root on the right.  OPERATIONS: 1. Central decompressive lumbar laminectomy for spinal stenosis at L3-     L4. 2. Microdiskectomy for herniated disk at L3-L4 on the right. 3. Foraminotomy for the L3 root on the right. 4. Foraminotomy for the L4 root on the right.  DESCRIPTION OF PROCEDURE:  Under general anesthesia, with the patient on a spinal frame, a routine orthopedic prep and draping of the lower back was carried out.  The appropriate time-out was carried out.  He had 2 g of IV Ancef.  At this time, 2 needles were placed in the back for localization purposes and an x-ray was taken.  Following this, an incision was made over the L3-4 interspace and extended proximally and distally.  The self-retaining retractors were inserted.  I then stripped the muscle from the lamina and spinous process bilaterally at L3-4.  I then placed a Kocher clamp on the spinous process of L3 to re-identify the space.  Once this was done, I then went down centrally and did a decompressive lumbar laminectomy in the usual fashion.  We experienced a great deal of hard  bone and a very tight canal at this point.  We took a great deal of time, brought the microscope in, protected the dura and did a nice dissection by going out and decompressing both lateral recesses.  I did nice foraminotomies for the L3 and the L4 root on the right.  We gently peeled the ligamentum flavum off the dura by protecting the dura with cottonoids.  We went far out laterally on the right.  Another x-ray was taken to make sure I was at the right space, we were.  I then placed the needle in the disk space just to make sure we were in the disk.  At this time, I protected the dura in the usual fashion.  Cruciate incision was made in the posterior longitudinal ligament and the diskectomy was carried out.  I went out wide into the foramen and decompressed the disk as well since the component of disk was in the foramen as well.  After this was done, I did a foraminotomy and I was able to easily pass a hockey-stick out the foramen for the L3 root.  I went down distally and did the same, did a nice foraminotomy for  the L4 root, and I was able to easily pass a hockey-stick out the foramen.  I was now able to easily remove the dura since we did a nice wide decompression.  I thoroughly irrigated out the area and loosely applied some thrombin-soaked Gelfoam.  I closed the wound in layers in usual fashion except I left a small distal and proximal deep part of the wound open for drainage purposes.  Subcu was closed with 2-0 Vicryl. Skin was closed with metal staples.  Sterile dressing was applied. Note, at the beginning of the procedure, I injected 20 mL of 0.5% Marcaine with epinephrine to control bleeding.  At the end of the case, I injected 20 mL of Exparel for pain control.          ______________________________ Georges Lynchonald A. Darrelyn HillockGioffre, M.D.     RAG/MEDQ  D:  09/06/2017  T:  09/07/2017  Job:  [id number]

## 2017-09-07 NOTE — Progress Notes (Addendum)
Discharge planning, no HH needs identified. Needs RW per PT, contacted [facility] to deliver to room. [phone]

## 2017-09-07 NOTE — Progress Notes (Signed)
Assessment unchanged. Pt verbalized understanding of dc instructions through teach back including when to call doctor, follow up care, and activity to resume. Scripts x 3 given as provided by MD. Dressing changed prior to dc home with gauze. Pt's car is in parking lot and wife home sick. Pt aware he is not permitted nor safe to drive yet therefore he called [employer] for transport home. Discharged via wc to front entrance to meet [employer] accompanied by NT.

## 2017-09-07 NOTE — Progress Notes (Signed)
Patient is ambulating for the second time and tolerates very well, patient is medicated with Percocet 2 tabs, he had Ambien 10 mg po earlier tonight, patient is asking for "Morphine drips" at present time, he does not appear to be in any distress, has been medicated with Percocet twice, Dilaudid twice and Robaxin once, and Ambien 10 mg po but stated that he is not sleeping.

## 2017-09-07 NOTE — Evaluation (Signed)
Occupational Therapy Evaluation and Discharge Patient Details Name: [name] [name] MRN: [id number] DOB: [birth year] Today's Date: 09/07/2017    History of Present Illness s/p decompressive lumbar laminectomy L3-L4, microdiscectomy L3 and 4, foraminotomy L3-L4 root   Clinical Impression   Pt educated in back precautions at length and reinforced with handout. Pt needing cues during sit to stand to avoid excessive bending. Overall performing ADL and ADL transfers modified independently. Recommending long handled bath sponge. No further OT needs.    Follow Up Recommendations  No OT follow up    Equipment Recommendations  None recommended by OT    Recommendations for Other Services       Precautions / Restrictions Precautions Precautions: Back Precaution Booklet Issued: Yes (comment) Restrictions Weight Bearing Restrictions: No      Mobility Bed Mobility Overal bed mobility: Modified Independent             General bed mobility comments: educated and demonstrated ability to perform log roll technique x 2  Transfers Overall transfer level: Modified independent Equipment used: None             General transfer comment: cues for technique to avoid bending    Balance                                           ADL either performed or assessed with clinical judgement   ADL Overall ADL's : Modified independent                                       General ADL Comments: Educated in back precautions related to ADL and IADL.     Vision Baseline Vision/History: Wears glasses Wears Glasses: At all times Patient Visual Report: No change from baseline       Perception     Praxis      Pertinent Vitals/Pain Pain Assessment: 0-10 Pain Score: 6  Pain Location: back incision Pain Descriptors / Indicators: (like a cut) Pain Intervention(s): Monitored during session;Premedicated before session;Repositioned     Hand Dominance  Right   Extremity/Trunk Assessment Upper Extremity Assessment Upper Extremity Assessment: Overall WFL for tasks assessed   Lower Extremity Assessment Lower Extremity Assessment: Defer to PT evaluation   Cervical / Trunk Assessment Cervical / Trunk Assessment: Other exceptions Cervical / Trunk Exceptions: s/p back sx   Communication Communication Communication: No difficulties   Cognition Arousal/Alertness: Awake/alert Behavior During Therapy: WFL for tasks assessed/performed Overall Cognitive Status: Within Functional Limits for tasks assessed                                     General Comments       Exercises     Shoulder Instructions      Home Living Family/patient expects to be discharged to:: Private residence Living Arrangements: Spouse/significant other Available Help at Discharge: Family;Available 24 hours/day Type of Home: House Home Access: Ramped entrance     Home Layout: One level     Bathroom Shower/Tub: Chief Strategy OfficerTub/shower unit   Bathroom Toilet: Handicapped height     Home Equipment: Grab bars - tub/shower          Prior Functioning/Environment Level of Independence: Independent  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, Nurse, adultdrives        OT Problem List:        OT Treatment/Interventions:      OT Goals(Current goals can be found in the care plan section)    OT Frequency:     Barriers to D/C:            Co-evaluation              AM-PAC PT "6 Clicks" Daily Activity     Outcome Measure Help from another person eating meals?: None Help from another person taking care of personal grooming?: None Help from another person toileting, which includes using toliet, bedpan, or urinal?: None Help from another person bathing (including washing, rinsing, drying)?: None Help from another person to put on and taking off regular upper body clothing?: None Help from another person to put on and taking off regular lower body clothing?:  None 6 Click Score: 24   End of Session Equipment Utilized During Treatment: Gait belt  Activity Tolerance: Patient tolerated treatment well Patient left: in bed;with call bell/phone within reach  OT Visit Diagnosis: Pain                Time: 0752-0821 OT Time Calculation (min): 29 min Charges:  OT General Charges $OT Visit: 1 Visit OT Evaluation $OT Eval Low Complexity: 1 Low OT Treatments $Self Care/Home Management : 8-22 mins G-Codes: OT G-codes **NOT FOR INPATIENT CLASS** Functional Assessment Tool Used: Clinical judgement Functional Limitation: Self care Self Care Current Status ([id number](G8987): At least 1 percent but less than 20 percent impaired, limited or restricted Self Care Goal Status (P2951(G8988): At least 1 percent but less than 20 percent impaired, limited or restricted Self Care Discharge Status [phone]([id number]): At least 1 percent but less than 20 percent impaired, limited or restricted   09/07/2017 Martie RoundJulie Jalaysia Lobb, OTR/L Pager: [phone]  Evern BioMayberry, Meoshia Billing Lynn 09/07/2017, 8:26 AM

## 2017-09-07 NOTE — Progress Notes (Signed)
   09/07/17 1000  PT Time Calculation  PT Start Time (ACUTE ONLY) 0902  PT Stop Time (ACUTE ONLY) 0924  PT Time Calculation (min) (ACUTE ONLY) 22 min  PT G-Codes **NOT FOR INPATIENT CLASS**  Functional Assessment Tool Used Clinical judgement  Functional Limitation Mobility: Walking and moving around  Mobility: Walking and Moving Around Current Status (Z6109(G8978) CJ  Mobility: Walking and Moving Around Goal Status (U0454(G8979) CJ  Mobility: Walking and Moving Around Discharge Status (U9811(G8980) CJ  PT General Charges  $$ ACUTE PT VISIT 1 Visit  PT Evaluation  $PT Eval Low Complexity 1 Low

## 2017-09-09 ENCOUNTER — Encounter (HOSPITAL_COMMUNITY): Payer: Self-pay | Admitting: Orthopedic Surgery

## 2017-09-09 NOTE — Anesthesia Postprocedure Evaluation (Signed)
Anesthesia Post Note  Patient: [name] [name]  Procedure(s) Performed: Decompressive lumbar laminiectomy for spinal stenosis at L3-L4, microdiscectomy at L3 and L4 of right, and foraminotomy for L3 and L4 root on right for foraminal stenosis (N/A Back)     Patient location during evaluation: PACU Anesthesia Type: General Level of consciousness: awake and alert Pain management: pain level controlled Vital Signs Assessment: post-procedure vital signs reviewed and stable Respiratory status: spontaneous breathing, nonlabored ventilation, respiratory function stable and patient connected to nasal cannula oxygen Cardiovascular status: blood pressure returned to baseline and stable Postop Assessment: no apparent nausea or vomiting Anesthetic complications: no    Last Vitals:  Vitals:   09/07/17 0620 09/07/17 0846  BP: (!) 172/102 (!) 151/98  Pulse: 67 79  Resp: 14 16  Temp: 36.8 C 36.9 C  SpO2: 96% 94%    Last Pain:  Vitals:   09/07/17 0846  TempSrc: Oral  PainSc:                  Gladyce Mcray
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
## 2017-09-17 DIAGNOSIS — I2692 Saddle embolus of pulmonary artery without acute cor pulmonale: Secondary | ICD-10-CM | POA: Insufficient documentation

## 2017-09-17 DIAGNOSIS — I10 Essential (primary) hypertension: Secondary | ICD-10-CM | POA: Insufficient documentation

## 2017-12-07 DIAGNOSIS — M47816 Spondylosis without myelopathy or radiculopathy, lumbar region: Secondary | ICD-10-CM | POA: Insufficient documentation

## 2017-12-21 DIAGNOSIS — M5136 Other intervertebral disc degeneration, lumbar region: Secondary | ICD-10-CM | POA: Insufficient documentation

## 2018-01-08 ENCOUNTER — Other Ambulatory Visit: Payer: Self-pay | Admitting: Neurological Surgery

## 2018-01-10 ENCOUNTER — Other Ambulatory Visit (HOSPITAL_COMMUNITY): Payer: Self-pay | Admitting: Neurosurgery

## 2018-01-10 ENCOUNTER — Other Ambulatory Visit: Payer: Self-pay | Admitting: Neurosurgery

## 2018-01-10 DIAGNOSIS — M961 Postlaminectomy syndrome, not elsewhere classified: Secondary | ICD-10-CM

## 2018-01-10 DIAGNOSIS — Z86711 Personal history of pulmonary embolism: Secondary | ICD-10-CM

## 2018-01-17 ENCOUNTER — Other Ambulatory Visit: Payer: Self-pay | Admitting: Radiology

## 2018-01-17 NOTE — Pre-Procedure Instructions (Signed)
Verlon Pischke [place]  01/17/2018      [facility] - [place], [place] - [address] AT [facility] [address] [place] [address] Phone: [phone] Fax: [phone]    Your procedure is scheduled on Jan 26, 2018.  Report to [facility] Admitting at 1020 AM.  Call this number if you have problems the morning of surgery:  [phone]   Continue all medications as directed by your physician except follow these medication instructions before surgery below   Remember:  Do not eat food or drink liquids after midnight.  Take these medicines the morning of surgery with A SIP OF WATER  Amlodipine (norvasc) Oxycodone-acetaminophen (percocet)-if needed for pain  Follow you doctor's instructions on when to begin holding xarelto  7 days prior to surgery STOP taking any Aspirin (unless otherwise instructed by your surgeon), Aleve, Naproxen, Ibuprofen, Motrin, Advil, Goody's, BC's, all herbal medications, fish oil, and all vitamins   Do not wear jewelry.  Do not wear lotions, powders, or colognes, or deodorant.  Men may shave face and neck.  Do not bring valuables to the hospital.  [facility] is not responsible for any belongings or valuables.  Contacts, dentures or bridgework may not be worn into surgery.  Leave your suitcase in the car.  After surgery it may be brought to your room.  For patients admitted to the hospital, discharge time will be determined by your treatment team.  Patients discharged the day of surgery will not be allowed to drive home.   [facility]- Preparing For Surgery  Before surgery, you can play an important role. Because skin is not sterile, your skin needs to be as free of germs as possible. You can reduce the number of germs on your skin by washing with CHG (chlorahexidine gluconate) Soap before surgery.  CHG is an antiseptic cleaner which kills germs and bonds with the skin to continue killing germs even  after washing.  Oral Hygiene is also important to reduce your risk of infection.  Remember - BRUSH YOUR TEETH THE MORNING OF SURGERY  Please do not use if you have an allergy to CHG or antibacterial soaps. If your skin becomes reddened/irritated stop using the CHG.  Do not shave (including legs and underarms) for at least 48 hours prior to first CHG shower. It is OK to shave your face.  Please follow these instructions carefully.   1. Shower the NIGHT BEFORE SURGERY and the MORNING OF SURGERY with CHG.   2. If you chose to wash your hair, wash your hair first as usual with your normal shampoo.  3. After you shampoo, rinse your hair and body thoroughly to remove the shampoo.  4. Use CHG as you would any other liquid soap. You can apply CHG directly to the skin and wash gently with a scrungie or a clean washcloth.   5. Apply the CHG Soap to your body ONLY FROM THE NECK DOWN.  Do not use on open wounds or open sores. Avoid contact with your eyes, ears, mouth and genitals (private parts). Wash Face and genitals (private parts)  with your normal soap.  6. Wash thoroughly, paying special attention to the area where your surgery will be performed.  7. Thoroughly rinse your body with warm water from the neck down.  8. DO NOT shower/wash with your normal soap after using and rinsing off the CHG Soap.  9. Dennie Bible  yourself dry with a CLEAN TOWEL.  10. Wear CLEAN PAJAMAS to bed the night before surgery, wear comfortable clothes the morning of surgery  11. Place CLEAN SHEETS on your bed the night of your first shower and DO NOT SLEEP WITH PETS.  Day of Surgery: Do not apply any deodorants/lotions. Please wear clean clothes to the hospital/surgery center.  Remember to brush your teeth.    Please read over the following fact sheets that you were given. Pain Booklet, Coughing and Deep Breathing, MRSA Information and Surgical Site Infection Prevention

## 2018-01-18 ENCOUNTER — Encounter (HOSPITAL_COMMUNITY): Payer: Self-pay

## 2018-01-18 ENCOUNTER — Other Ambulatory Visit: Payer: Self-pay | Admitting: Radiology

## 2018-01-18 ENCOUNTER — Encounter (HOSPITAL_COMMUNITY)
Admission: RE | Admit: 2018-01-18 | Discharge: 2018-01-18 | Disposition: A | Discharge status: Home / Self Care | Payer: Commercial Managed Care - PPO | Source: Ambulatory Visit | Attending: Neurological Surgery | Admitting: Neurological Surgery

## 2018-01-18 ENCOUNTER — Ambulatory Visit
Admission: RE | Admit: 2018-01-18 | Discharge: 2018-01-18 | Disposition: A | Discharge status: Home / Self Care | Payer: Commercial Managed Care - PPO | Source: Ambulatory Visit | Attending: Neurosurgery | Admitting: Neurosurgery

## 2018-01-18 ENCOUNTER — Ambulatory Visit (HOSPITAL_COMMUNITY): Admission: RE | Admit: 2018-01-18 | Payer: Commercial Managed Care - PPO | Source: Ambulatory Visit

## 2018-01-18 DIAGNOSIS — Z86711 Personal history of pulmonary embolism: Secondary | ICD-10-CM

## 2018-01-18 DIAGNOSIS — Z9889 Other specified postprocedural states: Secondary | ICD-10-CM | POA: Diagnosis not present

## 2018-01-18 DIAGNOSIS — I82503 Chronic embolism and thrombosis of unspecified deep veins of lower extremity, bilateral: Secondary | ICD-10-CM | POA: Diagnosis not present

## 2018-01-18 DIAGNOSIS — Z7901 Long term (current) use of anticoagulants: Secondary | ICD-10-CM | POA: Diagnosis not present

## 2018-01-18 DIAGNOSIS — Z87828 Personal history of other (healed) physical injury and trauma: Secondary | ICD-10-CM | POA: Diagnosis not present

## 2018-01-18 DIAGNOSIS — M961 Postlaminectomy syndrome, not elsewhere classified: Secondary | ICD-10-CM

## 2018-01-18 DIAGNOSIS — Z79899 Other long term (current) drug therapy: Secondary | ICD-10-CM | POA: Diagnosis not present

## 2018-01-18 DIAGNOSIS — I10 Essential (primary) hypertension: Secondary | ICD-10-CM | POA: Diagnosis not present

## 2018-01-18 HISTORY — PX: IR RADIOLOGIST EVAL & MGMT: IMG5224

## 2018-01-18 HISTORY — DX: Other pulmonary embolism without acute cor pulmonale: I26.99

## 2018-01-18 LAB — CBC WITH DIFFERENTIAL/PLATELET
BASOS ABS: 0 10*3/uL (ref 0.0–0.1)
BASOS PCT: 1 %
Eosinophils Absolute: 0.2 10*3/uL (ref 0.0–0.7)
Eosinophils Relative: 5 %
HCT: 42.4 % (ref 39.0–52.0)
HEMOGLOBIN: 14.6 g/dL (ref 13.0–17.0)
LYMPHS PCT: 59 %
Lymphs Abs: 2.6 10*3/uL (ref 0.7–4.0)
MCH: 28.6 pg (ref 26.0–34.0)
MCHC: 34.4 g/dL (ref 30.0–36.0)
MCV: 83 fL (ref 78.0–100.0)
MONO ABS: 0.3 10*3/uL (ref 0.1–1.0)
Monocytes Relative: 7 %
Neutro Abs: 1.2 10*3/uL — ABNORMAL LOW (ref 1.7–7.7)
Neutrophils Relative %: 28 %
Platelets: 305 10*3/uL (ref 150–400)
RBC: 5.11 MIL/uL (ref 4.22–5.81)
RDW: 14.7 % (ref 11.5–15.5)
WBC: 4.3 10*3/uL (ref 4.0–10.5)

## 2018-01-18 LAB — TYPE AND SCREEN
ABO/RH(D): O POS
ANTIBODY SCREEN: NEGATIVE

## 2018-01-18 LAB — BASIC METABOLIC PANEL
Anion gap: 9 (ref 5–15)
BUN: 10 mg/dL (ref 6–20)
CALCIUM: 9.4 mg/dL (ref 8.9–10.3)
CO2: 25 mmol/L (ref 22–32)
Chloride: 105 mmol/L (ref 101–111)
Creatinine, Ser: 0.97 mg/dL (ref 0.61–1.24)
GFR calc Af Amer: >60 mL/min (ref >60)
GLUCOSE: 109 mg/dL — AB (ref 65–99)
Potassium: 4.1 mmol/L (ref 3.5–5.1)
Sodium: 139 mmol/L (ref 135–145)

## 2018-01-18 LAB — PROTIME-INR
INR: 2.03
Prothrombin Time: 22.8 seconds — ABNORMAL HIGH (ref 11.4–15.2)

## 2018-01-18 LAB — ABO/RH: ABO/RH(D): O POS

## 2018-01-18 LAB — SURGICAL PCR SCREEN
MRSA, PCR: NEGATIVE
STAPHYLOCOCCUS AUREUS: NEGATIVE

## 2018-01-18 MED ORDER — CHLORHEXIDINE GLUCONATE CLOTH 2 % EX PADS: 6.0000 | MEDICATED_PAD | Freq: Once | CUTANEOUS | Status: DC
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
## 2018-01-19 ENCOUNTER — Encounter: Payer: Self-pay | Admitting: *Deleted

## 2018-01-19 ENCOUNTER — Encounter (HOSPITAL_COMMUNITY): Payer: Self-pay

## 2018-01-19 ENCOUNTER — Ambulatory Visit (HOSPITAL_COMMUNITY)
Admission: RE | Admit: 2018-01-19 | Discharge: 2018-01-19 | Disposition: A | Discharge status: Home / Self Care | Payer: Commercial Managed Care - PPO | Source: Ambulatory Visit | Attending: Neurosurgery | Admitting: Neurosurgery

## 2018-01-19 DIAGNOSIS — Z87828 Personal history of other (healed) physical injury and trauma: Secondary | ICD-10-CM | POA: Insufficient documentation

## 2018-01-19 DIAGNOSIS — I82503 Chronic embolism and thrombosis of unspecified deep veins of lower extremity, bilateral: Secondary | ICD-10-CM | POA: Diagnosis not present

## 2018-01-19 DIAGNOSIS — I10 Essential (primary) hypertension: Secondary | ICD-10-CM | POA: Insufficient documentation

## 2018-01-19 DIAGNOSIS — Z9889 Other specified postprocedural states: Secondary | ICD-10-CM | POA: Insufficient documentation

## 2018-01-19 DIAGNOSIS — Z7901 Long term (current) use of anticoagulants: Secondary | ICD-10-CM | POA: Insufficient documentation

## 2018-01-19 DIAGNOSIS — Z79899 Other long term (current) drug therapy: Secondary | ICD-10-CM | POA: Insufficient documentation

## 2018-01-19 DIAGNOSIS — M961 Postlaminectomy syndrome, not elsewhere classified: Secondary | ICD-10-CM

## 2018-01-19 HISTORY — PX: IR IVC FILTER PLMT / S&I /IMG GUID/MOD SED: IMG701

## 2018-01-19 MED ORDER — FENTANYL CITRATE (PF) 100 MCG/2ML IJ SOLN
INTRAMUSCULAR | Status: AC
  Filled 2018-01-19: qty 4

## 2018-01-19 MED ORDER — FENTANYL CITRATE (PF) 100 MCG/2ML IJ SOLN
INTRAMUSCULAR | Status: AC | PRN
  Administered 2018-01-19: 25 ug via INTRAVENOUS
  Administered 2018-01-19: 50 ug via INTRAVENOUS
  Administered 2018-01-19: 25 ug via INTRAVENOUS

## 2018-01-19 MED ORDER — SODIUM CHLORIDE 0.9 % IV SOLN
INTRAVENOUS | Status: DC
  Administered 2018-01-19: 10:00:00 via INTRAVENOUS

## 2018-01-19 MED ORDER — LIDOCAINE HCL 1 % IJ SOLN
INTRAMUSCULAR | Status: AC
  Filled 2018-01-19: qty 20

## 2018-01-19 MED ORDER — MIDAZOLAM HCL 2 MG/2ML IJ SOLN
INTRAMUSCULAR | Status: AC | PRN
  Administered 2018-01-19: 1 mg via INTRAVENOUS
  Administered 2018-01-19: 0.5 mg via INTRAVENOUS

## 2018-01-19 MED ORDER — MIDAZOLAM HCL 2 MG/2ML IJ SOLN
INTRAMUSCULAR | Status: AC
  Filled 2018-01-19: qty 4

## 2018-01-19 MED ORDER — IOPAMIDOL (ISOVUE-300) INJECTION 61%
INTRAVENOUS | Status: AC
  Administered 2018-01-19: 45 mL
  Filled 2018-01-19: qty 100

## 2018-01-19 MED ORDER — HYDROCODONE-ACETAMINOPHEN 5-325 MG PO TABS
ORAL_TABLET | ORAL | Status: AC
  Filled 2018-01-19: qty 2

## 2018-01-19 MED ORDER — LIDOCAINE HCL (PF) 1 % IJ SOLN
INTRAMUSCULAR | Status: AC | PRN
  Administered 2018-01-19: 5 mL

## 2018-01-19 MED ORDER — HYDROCODONE-ACETAMINOPHEN 5-325 MG PO TABS
1.0000 | ORAL_TABLET | ORAL | Status: DC | PRN
  Administered 2018-01-19: 2 via ORAL

## 2018-01-19 MED ORDER — MIDAZOLAM HCL 5 MG/5ML IJ SOLN
INTRAMUSCULAR | Status: AC | PRN
  Administered 2018-01-19: 0.5 mg via INTRAVENOUS

## 2018-01-19 NOTE — Procedures (Signed)
Pre procedural Dx: History of DVT and PE with impending back surgery Post Procedural Dx: Same  Successful placement of an infrarenal IVC filter via the R IJ vein.  EBL: Trace  Complications: None immediate  Katherina Right, MD Pager #: [phone]
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
## 2018-01-19 NOTE — Discharge Instructions (Signed)
Inferior Vena Cava Filter Insertion, Care After °This sheet gives you information about how to care for yourself after your procedure. Your health care provider may also give you more specific instructions. If you have problems or questions, contact your health care provider. °What can I expect after the procedure? °After your procedure, it is common to have: °· Mild pain in the area where the filter was inserted. °· Mild bruising in the area where the filter was inserted. ° °Follow these instructions at home: °Insertion site care °· Follow instructions from your health care provider about how to take care of the site where a catheter was inserted at your neck or groin (insertion site). Make sure you: °? Wash your hands with soap and water before you change your bandage (dressing). If soap and water are not available, use hand sanitizer. °? Change your dressing as told by your health care provider. °· Check your insertion site every day for signs of infection. Check for: °? More redness, swelling, or pain. °? More fluid or blood. °? Warmth. °? Pus or a bad smell. °· Keep the insertion site clean and dry. °· Do not shower, bathe, use a hot tub, or let the dressing get wet until your health care provider approves. °General instructions °· Take over-the-counter and prescription medicines only as told by your health care provider. °· Avoid heavy lifting or hard activities for 48 hours after the procedure or as told by your health care provider. °· Do not drive for 24 hours if you were given a a medicine to help you relax (sedative). °· Do not drive or use heavy machinery while taking prescription pain medicine. °· Do not go back to school or work until your health care provider approves. °· Keep all follow-up visits as told by your health care provider. This is important. °Contact a health care provider if: °· You have more redness, swelling, or pain around your insertion site. °· You have more fluid or blood coming  from your insertion site. °· Your insertion site feels warm to the touch. °· You have pus or a bad smell coming from your insertion site. °· You have a fever. °· You are dizzy. °· You have nausea and vomiting. °· You develop a rash. °Get help right away if: °· You develop chest pain, a cough, or difficulty breathing. °· You develop shortness of breath, feel faint, or pass out. °· You cough up blood. °· You have severe pain in your abdomen. °· You develop swelling and discoloration or pain in your legs. °· Your legs become pale and cold or blue. °· You develop weakness, difficulty moving your arms or legs, or balance problems. °· You develop problems with speech or vision. °These symptoms may represent a serious problem that is an emergency. Do not wait to see if the symptoms will go away. Get medical help right away. Call your local emergency services (911 in the [place] Do not drive yourself to the hospital. °Summary °· After your insertion procedure, it is common to have mild pain and bruising. °· Do not shower, bathe, use a hot tub, or let the dressing get wet until your health care provider approves. °· Every day, check for signs of infection where a catheter was inserted at your neck or groin (insertion site). °This information is not intended to replace advice given to you by your health care provider. Make sure you discuss any questions you have with your health care provider. °Document Released: 06/19/2013 Document   Revised: 07/20/2016 Document Reviewed: 07/20/2016 °Elsevier Interactive Patient Education © 2017 [employer] °Moderate Conscious Sedation, Adult, Care After °These instructions provide you with information about caring for yourself after your procedure. Your health care provider may also give you more specific instructions. Your treatment has been planned according to current medical practices, but problems sometimes occur. Call your health care provider if you have any problems or questions  after your procedure. °What can I expect after the procedure? °After your procedure, it is common: °· To feel sleepy for several hours. °· To feel clumsy and have poor balance for several hours. °· To have poor judgment for several hours. °· To vomit if you eat too soon. ° °Follow these instructions at home: °For at least 24 hours after the procedure: ° °· Do not: °? Participate in activities where you could fall or become injured. °? Drive. °? Use heavy machinery. °? Drink alcohol. °? Take sleeping pills or medicines that cause drowsiness. °? Make important decisions or sign legal documents. °? Take care of children on your own. °· Rest. °Eating and drinking °· Follow the diet recommended by your health care provider. °· If you vomit: °? Drink water, juice, or soup when you can drink without vomiting. °? Make sure you have little or no nausea before eating solid foods. °General instructions °· Have a responsible adult stay with you until you are awake and alert. °· Take over-the-counter and prescription medicines only as told by your health care provider. °· If you smoke, do not smoke without supervision. °· Keep all follow-up visits as told by your health care provider. This is important. °Contact a health care provider if: °· You keep feeling nauseous or you keep vomiting. °· You feel light-headed. °· You develop a rash. °· You have a fever. °Get help right away if: °· You have trouble breathing. °This information is not intended to replace advice given to you by your health care provider. Make sure you discuss any questions you have with your health care provider. °Document Released: 06/19/2013 Document Revised: 02/01/2016 Document Reviewed: 12/19/2015 °Elsevier Interactive Patient Education © 2018 Elsevier Inc. ° °

## 2018-01-19 NOTE — Progress Notes (Signed)
Ralene Muskrat, PA at bedside seeing pt. Made aware of elevated PT/INR which was drawn yesterday, no need to redraw today per her. Okay to proceed with procedure.

## 2018-01-26 ENCOUNTER — Inpatient Hospital Stay (HOSPITAL_COMMUNITY)
Admission: RE | Disposition: A | Discharge status: Home / Self Care | Payer: Self-pay | Source: Ambulatory Visit | Attending: Neurological Surgery

## 2018-01-26 ENCOUNTER — Inpatient Hospital Stay (HOSPITAL_COMMUNITY): Payer: Commercial Managed Care - PPO | Admitting: Emergency Medicine

## 2018-01-26 ENCOUNTER — Encounter (HOSPITAL_COMMUNITY): Payer: Self-pay | Admitting: Orthopedic Surgery

## 2018-01-26 ENCOUNTER — Other Ambulatory Visit: Payer: Self-pay

## 2018-01-26 ENCOUNTER — Inpatient Hospital Stay (HOSPITAL_COMMUNITY): Payer: Commercial Managed Care - PPO

## 2018-01-26 ENCOUNTER — Inpatient Hospital Stay (HOSPITAL_COMMUNITY)
Admission: RE | Admit: 2018-01-26 | Discharge: 2018-01-28 | DRG: 455 | Disposition: A | Discharge status: Home / Self Care | Payer: Commercial Managed Care - PPO | Source: Ambulatory Visit | Attending: Neurological Surgery | Admitting: Neurological Surgery

## 2018-01-26 DIAGNOSIS — Z419 Encounter for procedure for purposes other than remedying health state, unspecified: Secondary | ICD-10-CM

## 2018-01-26 DIAGNOSIS — M4316 Spondylolisthesis, lumbar region: Secondary | ICD-10-CM | POA: Diagnosis present

## 2018-01-26 DIAGNOSIS — Z981 Arthrodesis status: Secondary | ICD-10-CM

## 2018-01-26 DIAGNOSIS — I10 Essential (primary) hypertension: Secondary | ICD-10-CM | POA: Diagnosis present

## 2018-01-26 DIAGNOSIS — Z7901 Long term (current) use of anticoagulants: Secondary | ICD-10-CM | POA: Diagnosis not present

## 2018-01-26 DIAGNOSIS — M48061 Spinal stenosis, lumbar region without neurogenic claudication: Principal | ICD-10-CM | POA: Diagnosis present

## 2018-01-26 DIAGNOSIS — Z86711 Personal history of pulmonary embolism: Secondary | ICD-10-CM

## 2018-01-26 DIAGNOSIS — Z79899 Other long term (current) drug therapy: Secondary | ICD-10-CM

## 2018-01-26 DIAGNOSIS — Z86718 Personal history of other venous thrombosis and embolism: Secondary | ICD-10-CM

## 2018-01-26 LAB — PROTIME-INR
INR: 1.12
PROTHROMBIN TIME: 14.3 s (ref 11.4–15.2)

## 2018-01-26 SURGERY — POSTERIOR LUMBAR FUSION 2 LEVEL
Anesthesia: General | Site: Spine Lumbar

## 2018-01-26 MED ORDER — CEFAZOLIN SODIUM-DEXTROSE 2-4 GM/100ML-% IV SOLN
2.0000 g | INTRAVENOUS | Status: AC
  Administered 2018-01-26 (×2): 2 g via INTRAVENOUS

## 2018-01-26 MED ORDER — FENTANYL CITRATE (PF) 250 MCG/5ML IJ SOLN
INTRAMUSCULAR | Status: AC
Start: 2018-01-26 — End: ?
  Filled 2018-01-26: qty 5

## 2018-01-26 MED ORDER — MIDAZOLAM HCL 5 MG/5ML IJ SOLN
INTRAMUSCULAR | Status: DC | PRN
  Administered 2018-01-26: 2 mg via INTRAVENOUS

## 2018-01-26 MED ORDER — ONDANSETRON HCL 4 MG PO TABS: 4.0000 mg | ORAL_TABLET | Freq: Four times a day (QID) | ORAL | Status: DC | PRN

## 2018-01-26 MED ORDER — FENTANYL CITRATE (PF) 100 MCG/2ML IJ SOLN
INTRAMUSCULAR | Status: AC
  Administered 2018-01-26: 100 ug via INTRAVENOUS
  Filled 2018-01-26: qty 2

## 2018-01-26 MED ORDER — SODIUM CHLORIDE 0.9% FLUSH
3.0000 mL | Freq: Two times a day (BID) | INTRAVENOUS | Status: DC
  Administered 2018-01-26 – 2018-01-28 (×4): 3 mL via INTRAVENOUS

## 2018-01-26 MED ORDER — OXYCODONE HCL 5 MG PO TABS
10.0000 mg | ORAL_TABLET | ORAL | Status: DC | PRN
  Administered 2018-01-26 – 2018-01-28 (×11): 10 mg via ORAL
  Filled 2018-01-26 (×10): qty 2

## 2018-01-26 MED ORDER — POTASSIUM CHLORIDE IN NACL 20-0.9 MEQ/L-% IV SOLN: INTRAVENOUS | Status: DC

## 2018-01-26 MED ORDER — SUCCINYLCHOLINE CHLORIDE 200 MG/10ML IV SOSY
PREFILLED_SYRINGE | INTRAVENOUS | Status: AC
  Filled 2018-01-26: qty 10

## 2018-01-26 MED ORDER — THROMBIN 5000 UNITS EX SOLR
CUTANEOUS | Status: AC
  Filled 2018-01-26: qty 5000

## 2018-01-26 MED ORDER — HYDROMORPHONE HCL 1 MG/ML IJ SOLN
INTRAMUSCULAR | Status: DC | PRN
  Administered 2018-01-26: 1 mg via INTRAVENOUS

## 2018-01-26 MED ORDER — NEOSTIGMINE METHYLSULFATE 10 MG/10ML IV SOLN
INTRAVENOUS | Status: DC | PRN
  Administered 2018-01-26: 4 mg via INTRAVENOUS

## 2018-01-26 MED ORDER — SENNA 8.6 MG PO TABS
1.0000 | ORAL_TABLET | Freq: Two times a day (BID) | ORAL | Status: DC
  Administered 2018-01-26 – 2018-01-28 (×4): 8.6 mg via ORAL
  Filled 2018-01-26 (×4): qty 1

## 2018-01-26 MED ORDER — SODIUM CHLORIDE 0.9 % IV SOLN
INTRAVENOUS | Status: DC | PRN
  Administered 2018-01-26: 16:00:00

## 2018-01-26 MED ORDER — CELECOXIB 200 MG PO CAPS
200.0000 mg | ORAL_CAPSULE | Freq: Two times a day (BID) | ORAL | Status: DC
  Administered 2018-01-26 – 2018-01-28 (×4): 200 mg via ORAL
  Filled 2018-01-26 (×4): qty 1

## 2018-01-26 MED ORDER — DEXAMETHASONE SODIUM PHOSPHATE 10 MG/ML IJ SOLN
10.0000 mg | INTRAMUSCULAR | Status: AC
  Administered 2018-01-26: 10 mg via INTRAVENOUS

## 2018-01-26 MED ORDER — PHENOL 1.4 % MT LIQD: 1.0000 | OROMUCOSAL | Status: DC | PRN

## 2018-01-26 MED ORDER — SODIUM CHLORIDE 0.9 % IV SOLN
250.0000 mL | INTRAVENOUS | Status: DC
  Administered 2018-01-27: 250 mL via INTRAVENOUS

## 2018-01-26 MED ORDER — SUGAMMADEX SODIUM 200 MG/2ML IV SOLN
INTRAVENOUS | Status: AC
  Filled 2018-01-26: qty 2

## 2018-01-26 MED ORDER — FENTANYL CITRATE (PF) 100 MCG/2ML IJ SOLN
100.0000 ug | Freq: Once | INTRAMUSCULAR | Status: AC
  Administered 2018-01-26: 100 ug via INTRAVENOUS

## 2018-01-26 MED ORDER — ROCURONIUM BROMIDE 50 MG/5ML IV SOLN
INTRAVENOUS | Status: AC
  Filled 2018-01-26: qty 1

## 2018-01-26 MED ORDER — ROCURONIUM BROMIDE 100 MG/10ML IV SOLN
INTRAVENOUS | Status: DC | PRN
  Administered 2018-01-26: 10 mg via INTRAVENOUS
  Administered 2018-01-26: 20 mg via INTRAVENOUS
  Administered 2018-01-26: 50 mg via INTRAVENOUS
  Administered 2018-01-26: 40 mg via INTRAVENOUS

## 2018-01-26 MED ORDER — OXYCODONE HCL 5 MG PO TABS
ORAL_TABLET | ORAL | Status: AC
  Administered 2018-01-27: 10 mg via ORAL
  Filled 2018-01-26: qty 1

## 2018-01-26 MED ORDER — LIDOCAINE HCL (CARDIAC) PF 100 MG/5ML IV SOSY
PREFILLED_SYRINGE | INTRAVENOUS | Status: DC | PRN
  Administered 2018-01-26: 100 mg via INTRAVENOUS

## 2018-01-26 MED ORDER — LIDOCAINE 2% (20 MG/ML) 5 ML SYRINGE
INTRAMUSCULAR | Status: AC
  Filled 2018-01-26: qty 5

## 2018-01-26 MED ORDER — CEFAZOLIN SODIUM-DEXTROSE 2-4 GM/100ML-% IV SOLN
INTRAVENOUS | Status: AC
  Filled 2018-01-26: qty 100

## 2018-01-26 MED ORDER — DEXAMETHASONE SODIUM PHOSPHATE 10 MG/ML IJ SOLN
INTRAMUSCULAR | Status: AC
  Filled 2018-01-26: qty 1

## 2018-01-26 MED ORDER — VANCOMYCIN HCL 1000 MG IV SOLR
INTRAVENOUS | Status: AC
  Filled 2018-01-26: qty 1000

## 2018-01-26 MED ORDER — HYDROMORPHONE HCL 1 MG/ML IJ SOLN
INTRAMUSCULAR | Status: AC
  Filled 2018-01-26: qty 1

## 2018-01-26 MED ORDER — PROMETHAZINE HCL 25 MG/ML IJ SOLN: 6.2500 mg | INTRAMUSCULAR | Status: DC | PRN

## 2018-01-26 MED ORDER — OXYCODONE HCL 5 MG PO TABS
5.0000 mg | ORAL_TABLET | Freq: Once | ORAL | Status: AC | PRN
  Administered 2018-01-26: 5 mg via ORAL

## 2018-01-26 MED ORDER — SUCCINYLCHOLINE CHLORIDE 20 MG/ML IJ SOLN
INTRAMUSCULAR | Status: DC | PRN
  Administered 2018-01-26: 120 mg via INTRAVENOUS

## 2018-01-26 MED ORDER — CEFAZOLIN SODIUM-DEXTROSE 2-4 GM/100ML-% IV SOLN
2.0000 g | Freq: Three times a day (TID) | INTRAVENOUS | Status: AC
  Administered 2018-01-27 (×2): 2 g via INTRAVENOUS
  Filled 2018-01-26 (×2): qty 100

## 2018-01-26 MED ORDER — FENTANYL CITRATE (PF) 250 MCG/5ML IJ SOLN
INTRAMUSCULAR | Status: AC
  Filled 2018-01-26: qty 5

## 2018-01-26 MED ORDER — HYDROMORPHONE HCL 2 MG/ML IJ SOLN
INTRAMUSCULAR | Status: AC
  Filled 2018-01-26: qty 1

## 2018-01-26 MED ORDER — ONDANSETRON HCL 4 MG/2ML IJ SOLN: 4.0000 mg | Freq: Four times a day (QID) | INTRAMUSCULAR | Status: DC | PRN

## 2018-01-26 MED ORDER — BUPIVACAINE HCL (PF) 0.25 % IJ SOLN
INTRAMUSCULAR | Status: DC | PRN
  Administered 2018-01-26: 5 mL

## 2018-01-26 MED ORDER — CEFAZOLIN SODIUM 1 G IJ SOLR
INTRAMUSCULAR | Status: AC
  Filled 2018-01-26: qty 20

## 2018-01-26 MED ORDER — THROMBIN 20000 UNITS EX SOLR
CUTANEOUS | Status: AC
  Filled 2018-01-26: qty 20000

## 2018-01-26 MED ORDER — FENTANYL CITRATE (PF) 100 MCG/2ML IJ SOLN: 100.0000 ug | Freq: Once | INTRAMUSCULAR | Status: DC

## 2018-01-26 MED ORDER — ACETAMINOPHEN 650 MG RE SUPP: 650.0000 mg | RECTAL | Status: DC | PRN

## 2018-01-26 MED ORDER — ESMOLOL HCL 100 MG/10ML IV SOLN
INTRAVENOUS | Status: DC | PRN
  Administered 2018-01-26: 40 mg via INTRAVENOUS

## 2018-01-26 MED ORDER — ACETAMINOPHEN 325 MG PO TABS
650.0000 mg | ORAL_TABLET | ORAL | Status: DC | PRN
  Administered 2018-01-26 – 2018-01-27 (×3): 650 mg via ORAL
  Filled 2018-01-26 (×3): qty 2

## 2018-01-26 MED ORDER — PROPOFOL 10 MG/ML IV BOLUS
INTRAVENOUS | Status: AC
  Filled 2018-01-26: qty 20

## 2018-01-26 MED ORDER — PHENYLEPHRINE HCL 10 MG/ML IJ SOLN
INTRAMUSCULAR | Status: DC | PRN
  Administered 2018-01-26: 80 ug via INTRAVENOUS

## 2018-01-26 MED ORDER — THROMBIN (RECOMBINANT) 5000 UNITS EX SOLR
OROMUCOSAL | Status: DC | PRN
  Administered 2018-01-26: 16:00:00 via TOPICAL

## 2018-01-26 MED ORDER — HYDROMORPHONE HCL 2 MG/ML IJ SOLN
0.2500 mg | INTRAMUSCULAR | Status: DC | PRN
  Administered 2018-01-26 (×4): 0.5 mg via INTRAVENOUS

## 2018-01-26 MED ORDER — METHOCARBAMOL 1000 MG/10ML IJ SOLN
500.0000 mg | Freq: Four times a day (QID) | INTRAVENOUS | Status: DC | PRN
  Filled 2018-01-26: qty 5

## 2018-01-26 MED ORDER — MEPERIDINE HCL 50 MG/ML IJ SOLN: 6.2500 mg | INTRAMUSCULAR | Status: DC | PRN

## 2018-01-26 MED ORDER — MIDAZOLAM HCL 2 MG/2ML IJ SOLN
INTRAMUSCULAR | Status: AC
  Filled 2018-01-26: qty 2

## 2018-01-26 MED ORDER — BUPIVACAINE HCL (PF) 0.25 % IJ SOLN
INTRAMUSCULAR | Status: AC
  Filled 2018-01-26: qty 30

## 2018-01-26 MED ORDER — AMLODIPINE BESYLATE 10 MG PO TABS
10.0000 mg | ORAL_TABLET | Freq: Every day | ORAL | Status: DC
  Administered 2018-01-27 – 2018-01-28 (×2): 10 mg via ORAL
  Filled 2018-01-26 (×2): qty 1

## 2018-01-26 MED ORDER — 0.9 % SODIUM CHLORIDE (POUR BTL) OPTIME
TOPICAL | Status: DC | PRN
  Administered 2018-01-26: 1000 mL

## 2018-01-26 MED ORDER — NEOSTIGMINE METHYLSULFATE 5 MG/5ML IV SOSY
PREFILLED_SYRINGE | INTRAVENOUS | Status: AC
  Filled 2018-01-26: qty 5

## 2018-01-26 MED ORDER — PHENYLEPHRINE HCL 10 MG/ML IJ SOLN
INTRAVENOUS | Status: DC | PRN
  Administered 2018-01-26: 20 ug/min via INTRAVENOUS

## 2018-01-26 MED ORDER — HEPARIN SODIUM (PORCINE) 1000 UNIT/ML IJ SOLN
INTRAMUSCULAR | Status: AC
  Filled 2018-01-26: qty 1

## 2018-01-26 MED ORDER — PROPOFOL 10 MG/ML IV BOLUS
INTRAVENOUS | Status: DC | PRN
  Administered 2018-01-26: 200 mg via INTRAVENOUS

## 2018-01-26 MED ORDER — ONDANSETRON HCL 4 MG/2ML IJ SOLN
INTRAMUSCULAR | Status: AC
  Filled 2018-01-26: qty 2

## 2018-01-26 MED ORDER — MENTHOL 3 MG MT LOZG: 1.0000 | LOZENGE | OROMUCOSAL | Status: DC | PRN

## 2018-01-26 MED ORDER — OXYCODONE HCL 5 MG/5ML PO SOLN: 5.0000 mg | Freq: Once | ORAL | Status: AC | PRN

## 2018-01-26 MED ORDER — ALBUMIN HUMAN 5 % IV SOLN
INTRAVENOUS | Status: DC | PRN
  Administered 2018-01-26 (×2): via INTRAVENOUS

## 2018-01-26 MED ORDER — HEMOSTATIC AGENTS (NO CHARGE) OPTIME
TOPICAL | Status: DC | PRN
  Administered 2018-01-26: 1 via TOPICAL

## 2018-01-26 MED ORDER — FENTANYL CITRATE (PF) 100 MCG/2ML IJ SOLN
INTRAMUSCULAR | Status: DC | PRN
  Administered 2018-01-26 (×4): 50 ug via INTRAVENOUS
  Administered 2018-01-26: 100 ug via INTRAVENOUS
  Administered 2018-01-26: 50 ug via INTRAVENOUS
  Administered 2018-01-26 (×3): 100 ug via INTRAVENOUS
  Administered 2018-01-26 (×2): 50 ug via INTRAVENOUS

## 2018-01-26 MED ORDER — LACTATED RINGERS IV SOLN
INTRAVENOUS | Status: DC
  Administered 2018-01-26 (×3): via INTRAVENOUS

## 2018-01-26 MED ORDER — ONDANSETRON HCL 4 MG/2ML IJ SOLN
INTRAMUSCULAR | Status: DC | PRN
  Administered 2018-01-26: 4 mg via INTRAVENOUS

## 2018-01-26 MED ORDER — HYDROMORPHONE HCL 1 MG/ML IJ SOLN
0.5000 mg | INTRAMUSCULAR | Status: DC | PRN
  Administered 2018-01-26 – 2018-01-28 (×12): 0.5 mg via INTRAVENOUS
  Filled 2018-01-26 (×12): qty 1

## 2018-01-26 MED ORDER — SODIUM CHLORIDE 0.9% FLUSH: 3.0000 mL | INTRAVENOUS | Status: DC | PRN

## 2018-01-26 MED ORDER — METHOCARBAMOL 500 MG PO TABS
500.0000 mg | ORAL_TABLET | Freq: Four times a day (QID) | ORAL | Status: DC | PRN
  Administered 2018-01-26 – 2018-01-28 (×5): 500 mg via ORAL
  Filled 2018-01-26 (×5): qty 1

## 2018-01-26 MED ORDER — GLYCOPYRROLATE 0.2 MG/ML IJ SOLN
INTRAMUSCULAR | Status: DC | PRN
  Administered 2018-01-26: .8 mg via INTRAVENOUS

## 2018-01-26 SURGICAL SUPPLY — 66 items
BAG DECANTER FOR FLEXI CONT (MISCELLANEOUS) ×3 IMPLANT
BASKET BONE COLLECTION (BASKET) ×3 IMPLANT
BENZOIN TINCTURE PRP APPL 2/3 (GAUZE/BANDAGES/DRESSINGS) ×3 IMPLANT
BLADE CLIPPER SURG (BLADE) IMPLANT
BUR MATCHSTICK NEURO 3.0 LAGG (BURR) ×3 IMPLANT
CANISTER SUCT 3000ML PPV (MISCELLANEOUS) ×3 IMPLANT
CARTRIDGE OIL MAESTRO DRILL (MISCELLANEOUS) ×1 IMPLANT
CLOSURE WOUND 1/2 X4 (GAUZE/BANDAGES/DRESSINGS) ×1
CONT SPEC 4OZ CLIKSEAL STRL BL (MISCELLANEOUS) ×3 IMPLANT
COVER BACK TABLE 60X90IN (DRAPES) ×3 IMPLANT
DERMABOND ADVANCED (GAUZE/BANDAGES/DRESSINGS) ×2
DERMABOND ADVANCED .7 DNX12 (GAUZE/BANDAGES/DRESSINGS) ×1 IMPLANT
DIFFUSER DRILL AIR PNEUMATIC (MISCELLANEOUS) ×3 IMPLANT
DRAPE C-ARM 42X72 X-RAY (DRAPES) ×3 IMPLANT
DRAPE C-ARMOR (DRAPES) ×3 IMPLANT
DRAPE LAPAROTOMY 100X72X124 (DRAPES) ×3 IMPLANT
DRAPE POUCH INSTRU U-SHP 10X18 (DRAPES) ×3 IMPLANT
DRAPE SURG 17X23 STRL (DRAPES) ×3 IMPLANT
DRSG OPSITE POSTOP 4X8 (GAUZE/BANDAGES/DRESSINGS) ×3 IMPLANT
DURAPREP 26ML APPLICATOR (WOUND CARE) ×3 IMPLANT
ELECT REM PT RETURN 9FT ADLT (ELECTROSURGICAL) ×3
ELECTRODE REM PT RTRN 9FT ADLT (ELECTROSURGICAL) ×1 IMPLANT
EVACUATOR 1/8 PVC DRAIN (DRAIN) ×3 IMPLANT
GAUZE SPONGE 4X4 16PLY XRAY LF (GAUZE/BANDAGES/DRESSINGS) IMPLANT
GLOVE BIO SURGEON STRL SZ7 (GLOVE) IMPLANT
GLOVE BIO SURGEON STRL SZ8 (GLOVE) ×6 IMPLANT
GLOVE BIOGEL PI IND STRL 7.0 (GLOVE) ×2 IMPLANT
GLOVE BIOGEL PI IND STRL 7.5 (GLOVE) ×3 IMPLANT
GLOVE BIOGEL PI INDICATOR 7.0 (GLOVE) ×4
GLOVE BIOGEL PI INDICATOR 7.5 (GLOVE) ×6
GLOVE SURG SS PI 7.0 STRL IVOR (GLOVE) ×6 IMPLANT
GOWN STRL REUS W/ TWL LRG LVL3 (GOWN DISPOSABLE) IMPLANT
GOWN STRL REUS W/ TWL XL LVL3 (GOWN DISPOSABLE) ×2 IMPLANT
GOWN STRL REUS W/TWL 2XL LVL3 (GOWN DISPOSABLE) IMPLANT
GOWN STRL REUS W/TWL LRG LVL3 (GOWN DISPOSABLE)
GOWN STRL REUS W/TWL XL LVL3 (GOWN DISPOSABLE) ×4
HEMOSTAT POWDER KIT SURGIFOAM (HEMOSTASIS) ×3 IMPLANT
KIT BASIN OR (CUSTOM PROCEDURE TRAY) ×3 IMPLANT
KIT BONE MRW ASP ANGEL CPRP (KITS) ×3 IMPLANT
KIT TURNOVER KIT B (KITS) ×3 IMPLANT
MILL MEDIUM DISP (BLADE) IMPLANT
NEEDLE HYPO 25X1 1.5 SAFETY (NEEDLE) ×3 IMPLANT
NS IRRIG 1000ML POUR BTL (IV SOLUTION) ×3 IMPLANT
OIL CARTRIDGE MAESTRO DRILL (MISCELLANEOUS) ×3
PACK LAMINECTOMY NEURO (CUSTOM PROCEDURE TRAY) ×3 IMPLANT
PAD ARMBOARD 7.5X6 YLW CONV (MISCELLANEOUS) ×9 IMPLANT
PUTTY DBM ALLOSYNC PURE 10CC (Putty) ×3 IMPLANT
ROD PC 5.5X65 TI ARSENAL (Rod) ×6 IMPLANT
SCREW CBX 5.5X45 ARSENAL (Screw) ×12 IMPLANT
SCREW CORT CANC 5.5X40 (Screw) ×6 IMPLANT
SCREW SET SPINAL ARSENAL 47127 (Screw) ×18 IMPLANT
SPACER IDENTITI PS 11X9X25 10D (Spacer) ×6 IMPLANT
SPACER IDENTITI PS 13X9X25 10D (Spacer) ×6 IMPLANT
SPONGE LAP 4X18 X RAY DECT (DISPOSABLE) IMPLANT
SPONGE SURGIFOAM ABS GEL 100 (HEMOSTASIS) ×3 IMPLANT
STRIP CLOSURE SKIN 1/2X4 (GAUZE/BANDAGES/DRESSINGS) ×2 IMPLANT
STRIP MATRIX NEOCORE 20CC (Putty) ×3 IMPLANT
SUT VIC AB 0 CT1 18XCR BRD8 (SUTURE) ×1 IMPLANT
SUT VIC AB 0 CT1 8-18 (SUTURE) ×2
SUT VIC AB 2-0 CP2 18 (SUTURE) ×3 IMPLANT
SUT VIC AB 3-0 SH 8-18 (SUTURE) ×6 IMPLANT
SYR CONTROL 10ML LL (SYRINGE) ×3 IMPLANT
TOWEL GREEN STERILE (TOWEL DISPOSABLE) ×3 IMPLANT
TOWEL GREEN STERILE FF (TOWEL DISPOSABLE) ×3 IMPLANT
TRAY FOLEY MTR SLVR 16FR STAT (SET/KITS/TRAYS/PACK) ×3 IMPLANT
WATER STERILE IRR 1000ML POUR (IV SOLUTION) ×3 IMPLANT
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
## 2018-01-26 NOTE — Anesthesia Preprocedure Evaluation (Signed)
Anesthesia Evaluation  Patient identified by MRN, date of birth, ID band Patient awake    Reviewed: Allergy & Precautions, NPO status , Patient's Chart, lab work & pertinent test results  History of Anesthesia Complications Negative for: history of anesthetic complications  Airway Mallampati: III  TM Distance: >3 FB Neck ROM: Full    Dental  (+) Poor Dentition, Missing   Pulmonary neg pulmonary ROS,    breath sounds clear to auscultation       Cardiovascular hypertension, Pt. on medications  Rhythm:Regular     Neuro/Psych PSYCHIATRIC DISORDERS Anxiety Depression  Neuromuscular disease    GI/Hepatic negative GI ROS, Neg liver ROS,   Endo/Other  negative endocrine ROS  Renal/GU negative Renal ROS     Musculoskeletal   Abdominal   Peds  Hematology negative hematology ROS (+)   Anesthesia Other Findings   Reproductive/Obstetrics                             Anesthesia Physical  Anesthesia Plan  ASA: II  Anesthesia Plan: General   Post-op Pain Management:    Induction: Intravenous  PONV Risk Score and Plan: 2 and Ondansetron and Midazolam  Airway Management Planned: Oral ETT  Additional Equipment: None  Intra-op Plan:   Post-operative Plan: Extubation in OR  Informed Consent: I have reviewed the patients History and Physical, chart, labs and discussed the procedure including the risks, benefits and alternatives for the proposed anesthesia with the patient or authorized representative who has indicated his/her understanding and acceptance.   Dental advisory given  Plan Discussed with: CRNA and Surgeon  Anesthesia Plan Comments:         Anesthesia Quick Evaluation

## 2018-01-26 NOTE — Transfer of Care (Signed)
Immediate Anesthesia Transfer of Care Note  Patient: [name] [name]  Procedure(s) Performed: LUMBAR THREE-FOUR, LUMBAR FOUR-FIVE POSTERIOR LUMBAR INTERBODY FUSION (N/A Spine Lumbar)  Patient Location: PACU  Anesthesia Type:General  Level of Consciousness: awake, alert  and patient cooperative  Airway & Oxygen Therapy: Patient Spontanous Breathing and Patient connected to nasal cannula oxygen  Post-op Assessment: Report given to RN and Post -op Vital signs reviewed and stable  Post vital signs: Reviewed and stable  Last Vitals:  Vitals Value Taken Time  BP 149/110 01/26/2018  7:25 PM  Temp    Pulse 99 01/26/2018  7:27 PM  Resp 11 01/26/2018  7:27 PM  SpO2 98 % 01/26/2018  7:27 PM  Vitals shown include unvalidated device data.  Last Pain:  Vitals:   01/26/18 1228  TempSrc:   PainSc: 7          Complications: No apparent anesthesia complications

## 2018-01-26 NOTE — Progress Notes (Signed)
Orthopedic Tech Progress Note Patient Details:  [name] [name] [name] [name] [birth year] [id number] Brace completed by bio-tech. Patient ID: [name] [name], male   DOB: July 22, 1961, 57 y.o.   MRN: [id number]   [name] [name] 01/26/2018, 10:43 PM

## 2018-01-26 NOTE — Anesthesia Postprocedure Evaluation (Signed)
Anesthesia Post Note  Patient: [name] [name]  Procedure(s) Performed: LUMBAR THREE-FOUR, LUMBAR FOUR-FIVE POSTERIOR LUMBAR INTERBODY FUSION (N/A Spine Lumbar)     Patient location during evaluation: PACU Anesthesia Type: General Level of consciousness: awake and alert Pain management: pain level controlled Vital Signs Assessment: post-procedure vital signs reviewed and stable Respiratory status: spontaneous breathing, nonlabored ventilation, respiratory function stable and patient connected to nasal cannula oxygen Cardiovascular status: blood pressure returned to baseline and stable Postop Assessment: no apparent nausea or vomiting Anesthetic complications: no    Last Vitals:  Vitals:   01/26/18 2025 01/26/18 2030  BP:  (!) 147/97  Pulse: 98 88  Resp: 15 15  Temp: (!) 36.4 C   SpO2: 96% 95%    Last Pain:  Vitals:   01/26/18 2025  TempSrc:   PainSc: 8                  Janna Oak,W. EDMOND

## 2018-01-26 NOTE — Op Note (Addendum)
01/26/2018  7:03 PM  PATIENT:  [name] [name]  57 y.o. 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 Decompressive lumbar laminectomy L3-4 and L4-5 requiring more work than would be required for a simple exposure of the disk for PLIF in order to adequately decompress the neural elements and address the spinal stenosis 2. Posterior lumbar interbody fusion L3-4 and L4-5 using porous titanium interbody cages packed with morcellized allograft and autograft 3. Posterior fixation L3-L5 inclusive using Alphatec cortical pedicle screws.  4. Intertransverse arthrodesis L3-L5 using morcellized autograft and allograft. 5. Bone marrow aspirate through a separate fascial incision over the right iliac crest  SURGEON:  Marikay Alar, MD  ASSISTANTS: Verlin Dike FNP  ANESTHESIA:  General  EBL: 200 ml  No intake/output data recorded.  BLOOD ADMINISTERED:none  DRAINS: none   INDICATION FOR PROCEDURE: This patient presented with severe back and bilateral leg pain. Imaging revealed postlaminectomy spondylolisthesis. The patient tried a reasonable attempt at conservative medical measures without relief. I recommended decompression and instrumented fusion to address the stenosis as well as the segmental  instability.  Patient understood the risks, benefits, and alternatives and potential outcomes and wished to proceed.  PROCEDURE DETAILS:  The patient was brought to the operating room. After induction of generalized endotracheal anesthesia the patient was rolled into the prone position on chest rolls and all pressure points were padded. The patient's lumbar region was cleaned and then prepped with DuraPrep and draped in the usual sterile fashion. Anesthesia was injected and then a dorsal midline incision was made and carried down to the lumbosacral  fascia. The fascia was opened and the paraspinous musculature was taken down in a subperiosteal fashion to expose L3-4 and L4-5. We also dissected in a suprafascial plane to expose the right iliac crest. We placed a Jamshidi needle into the right iliac crest and extracted about 50 mL of bone marrow aspirate which was spun down for later arthrodesis. We closed the fascia and continued our exposure. A self-retaining retractor was placed. The patient had had a previous decompressive laminectomy at L3-4. Great care was taken to expose the pars and facets. The spinous process was gone. Intraoperative fluoroscopy confirmed my level, and I started with placement of the L3 cortical pedicle screws. The pedicle screw entry zones were identified utilizing surface landmarks and  AP and lateral fluoroscopy. I scored the cortex with the high-speed drill and then used the hand drill to drill an upward and outward direction into the pedicle. I then tapped line to line. I then placed a 5.5 x 45 mm cortical pedicle screw into the pedicles of L3 bilaterally. I then turned my attention to the decompression and complete lumbar laminectomies, hemi- facetectomies, and foraminotomies were performed at L3-4 and L4-5. The patient had significant spinal stenosis and this required more work than would be required for a simple exposure of the disc for posterior lumbar interbody fusion which would only require a limited laminotomy. Much more generous decompression and generous foraminotomy was undertaken in order to adequately decompress the neural elements and address the patient's leg pain. The yellow ligament was removed to expose the underlying dura and nerve roots, and generous foraminotomies were performed to adequately decompress the neural elements. Both the exiting and traversing nerve roots were decompressed on both sides until a coronary dilator passed easily along the nerve roots. Once the decompression was  complete, I turned my  attention to the posterior lower lumbar interbody fusion. The epidural venous vasculature was coagulated and cut sharply. Disc space was incised and the initial discectomy was performed with pituitary rongeurs. The disc space was distracted with sequential distractors to a height of 15 mm at L3-4 and 12 mm at L4-5. We then used a series of scrapers and shavers to prepare the endplates for fusion. The midline was prepared with Epstein curettes. Once the complete discectomy was finished, we packed an appropriate sized interbody cage with local autograft and morcellized allograft, gently retracted the nerve root, and tapped the cage into position at L3-4 and L4-5.  The midline between the cages was packed with morselized autograft and allograft. We then turned our attention to the placement of the lower pedicle screws. The pedicle screw entry zones were identified utilizing surface landmarks and fluoroscopy. I drilled into each pedicle utilizing the hand drill, and tapped each pedicle with the appropriate tap. We palpated with a ball probe to assure no break in the cortex. We then placed 5.5 x 45 mm cortical pedicle screws into the pedicles bilaterally at L4 and L5 bilaterally. We then decorticated the transverse processes and laid a mixture of morcellized autograft and allograft out over these to perform intertransverse arthrodesis at L3-4 and L4-5. We then placed lordotic rods into the multiaxial screw heads of the pedicle screws and locked these in position with the locking caps and anti-torque device. We then checked our construct with AP and lateral fluoroscopy. Irrigated with copious amounts of bacitracin-containing saline solution. Inspected the nerve roots once again to assure adequate decompression, lined to the dura with Gelfoam, placed powdered vancomycin into the wound, and closed the muscle and the fascia with 0 Vicryl. Closed the subcutaneous tissues with 2-0 Vicryl and subcuticular tissues with 3-0  Vicryl. The skin was closed with benzoin and Steri-Strips. Dressing was then applied, the patient was awakened from general anesthesia and transported to the recovery room in stable condition. At the end of the procedure all sponge, needle and instrument counts were correct.   PLAN OF CARE: admit to inpatient  PATIENT DISPOSITION:  PACU - hemodynamically stable.   Delay start of Pharmacological VTE agent (>24hrs) due to surgical blood loss or risk of bleeding:  yes

## 2018-01-26 NOTE — Progress Notes (Signed)
Orthopedic Tech Progress Note Patient Details:  [name] 1961/05/11 [id number] Called bio-tech for brace. Patient ID: [name] [name], male   DOB: 1961-09-10, 57 y.o.   MRN: [id number]   [name] 01/26/2018, 9:21 PM

## 2018-01-26 NOTE — Anesthesia Procedure Notes (Signed)
Procedure Name: Intubation Date/Time: 01/26/2018 2:38 PM Performed by: Coralee Rud, CRNA Pre-anesthesia Checklist: Patient identified, Emergency Drugs available, Suction available and Patient being monitored Patient Re-evaluated:Patient Re-evaluated prior to induction Oxygen Delivery Method: Circle System Utilized Preoxygenation: Pre-oxygenation with 100% oxygen Induction Type: IV induction Ventilation: Mask ventilation without difficulty and Oral airway inserted - appropriate to patient size Laryngoscope Size: Miller and 3 Grade View: Grade II Tube type: Oral Tube size: 7.5 mm Number of attempts: 1 Airway Equipment and Method: Stylet and Oral airway Placement Confirmation: ETT inserted through vocal cords under direct vision,  positive ETCO2 and breath sounds checked- equal and bilateral Secured at: 24 cm Tube secured with: Tape Dental Injury: Teeth and Oropharynx as per pre-operative assessment

## 2018-01-27 ENCOUNTER — Other Ambulatory Visit: Payer: Self-pay

## 2018-01-27 NOTE — Progress Notes (Signed)
Pt able to stand up at bedside one time this shift. Foley cath removed and pt tolerated well. PT reports little relief from pain management and is inquiring about use of Fentanyl that has been successful for him in the past. This noted pt is getting some relief 5/10 from current regimen. [name] [name], 01/27/2018, 7:04 AM

## 2018-01-27 NOTE — Progress Notes (Signed)
Patient ID: [name] [name], male   DOB: 1961/06/06, 57 y.o.   MRN: [id number] Subjective: Patient reports he's doing well, mold soreness, no leg pain  Objective: Vital signs in last 24 hours: Temp:  [97.5 F (36.4 C)-98.6 F (37 C)] 98.1 F (36.7 C) (05/18 0733) Pulse Rate:  [79-98] 87 (05/18 0733) Resp:  [12-20] 16 (05/18 0733) BP: (135-159)/(85-110) 136/89 (05/18 0733) SpO2:  [92 %-100 %] 95 % (05/18 0733) Weight:  [106.6 kg (235 lb)-107.6 kg (237 lb 3.4 oz)] 107.6 kg (237 lb 3.4 oz) (05/17 2230)  Intake/Output from previous day: 05/17 0701 - 05/18 0700 In: 3308.5 [P.O.:708; I.V.:2000.5; IV Piggyback:600] Out: 1785 [Urine:1135; Blood:650] Intake/Output this shift: No intake/output data recorded.  Neuro exam normal  Lab Results: Lab Results  Component Value Date   WBC 4.3 01/18/2018   HGB 14.6 01/18/2018   HCT 42.4 01/18/2018   MCV 83.0 01/18/2018   PLT 305 01/18/2018   Lab Results  Component Value Date   INR 1.12 01/26/2018   BMET Lab Results  Component Value Date   NA 139 01/18/2018   K 4.1 01/18/2018   CL 105 01/18/2018   CO2 25 01/18/2018   GLUCOSE 109 (H) 01/18/2018   BUN 10 01/18/2018   CREATININE 0.97 01/18/2018   CALCIUM 9.4 01/18/2018    Studies/Results: Dg Lumbar Spine 2-3 Views  Result Date: 01/26/2018 CLINICAL DATA:  L3-4 and L4-5 PLIF EXAM: LUMBAR SPINE - 2-3 VIEW; DG C-ARM 61-120 MIN COMPARISON:  None. FINDINGS: Two intraoperative views during L3-4 and L4-5 PLIF procedure are provided without immediate complications noted. Hardware is in place spanning L3 through L5 with interbody blocks noted. A total of 1:08 minutes of fluoroscopic time was utilized. IMPRESSION: Fluoroscopic time utilized for L3-4 and L4-5 PLIF procedure. Two views of the lower lumbar spine demonstrate hardware place without immediate complications. Electronically Signed   By: Tollie Eth M.D.   On: 01/26/2018 19:49   Dg C-arm 1-60 Min  Result Date: 01/26/2018 CLINICAL DATA:   L3-4 and L4-5 PLIF EXAM: LUMBAR SPINE - 2-3 VIEW; DG C-ARM 61-120 MIN COMPARISON:  None. FINDINGS: Two intraoperative views during L3-4 and L4-5 PLIF procedure are provided without immediate complications noted. Hardware is in place spanning L3 through L5 with interbody blocks noted. A total of 1:08 minutes of fluoroscopic time was utilized. IMPRESSION: Fluoroscopic time utilized for L3-4 and L4-5 PLIF procedure. Two views of the lower lumbar spine demonstrate hardware place without immediate complications. Electronically Signed   By: Tollie Eth M.D.   On: 01/26/2018 19:49   Dg C-arm 1-60 Min  Result Date: 01/26/2018 CLINICAL DATA:  L3-4 and L4-5 PLIF EXAM: LUMBAR SPINE - 2-3 VIEW; DG C-ARM 61-120 MIN COMPARISON:  None. FINDINGS: Two intraoperative views during L3-4 and L4-5 PLIF procedure are provided without immediate complications noted. Hardware is in place spanning L3 through L5 with interbody blocks noted. A total of 1:08 minutes of fluoroscopic time was utilized. IMPRESSION: Fluoroscopic time utilized for L3-4 and L4-5 PLIF procedure. Two views of the lower lumbar spine demonstrate hardware place without immediate complications. Electronically Signed   By: Tollie Eth M.D.   On: 01/26/2018 19:49    Assessment/Plan: Doing well, mobilize today  Estimated body mass index is 30.46 kg/m as calculated from the following:   Height as of this encounter:  (1.88 m).   Weight as of this encounter: 107.6 kg (237 lb 3.4 oz).    LOS: 1 day    Arvell Pulsifer S 01/27/2018,  7:53 AM

## 2018-01-27 NOTE — Progress Notes (Signed)
Occupational Therapy Evaluation Patient Details Name: [name] [name] MRN: [id number] DOB: 29-Aug-1961 Today's Date: 01/27/2018    History of Present Illness Patient is a 57 yo male s/p LUMBAR THREE-FOUR, LUMBAR FOUR-FIVE POSTERIOR LUMBAR INTERBODY FUSION   Clinical Impression   Patient evaluated by Occupational Therapy with no further acute OT needs identified. All education has been completed and the patient has no further questions. Pt is able to perform ADLs mod I to supervision/set up level.  He was provided with back handout and reviewed back precautions extensively as they relate to ADLs and IADLs.  See below for any follow-up Occupational Therapy or equipment needs. OT is signing off. Thank you for this referral.      Follow Up Recommendations  No OT follow up    Equipment Recommendations  None recommended by OT    Recommendations for Other Services       Precautions / Restrictions Precautions Precautions: Back Precaution Booklet Issued: Yes (comment) Precaution Comments: Pt provided with back handout and this was reviewed with pt  Required Braces or Orthoses: Spinal Brace Spinal Brace: Lumbar corset      Mobility Bed Mobility Overal bed mobility: Modified Independent             General bed mobility comments: increased time but no difficulty to perform.  Transfers Overall transfer level: Independent Equipment used: None             General transfer comment: no physical assist, good time to power up    Balance Overall balance assessment: Independent                                         ADL either performed or assessed with clinical judgement   ADL Overall ADL's : Needs assistance/impaired Eating/Feeding: Independent   Grooming: Wash/dry hands;Wash/dry face;Oral care;Brushing hair;Supervision/safety;Standing Grooming Details (indicate cue type and reason): Pt instructed in safe techniques for grooming to avoid bending.  He  verbalized understanding  Upper Body Bathing: Set up;Standing   Lower Body Bathing: Supervison/ safety;Sit to/from stand Lower Body Bathing Details (indicate cue type and reason): Pt instructed in use of LH sponge/brush, or sit to wash feet  Upper Body Dressing : Set up;Sitting   Lower Body Dressing: Supervision/safety;Sit to/from stand Lower Body Dressing Details (indicate cue type and reason): Pt able to cross ankles over knees to access feet.  He was instructed in safe technique for LB ADLs and to sit to don pants  Toilet Transfer: Modified Independent;Ambulation;Comfort height toilet;Grab bars   Toileting- Clothing Manipulation and Hygiene: Modified independent;Sit to/from stand   Tub/ Shower Transfer: Tub transfer;Supervision/safety;Ambulation;Grab bars   Functional mobility during ADLs: Modified independent General ADL Comments: reviewed safety with IADLs and to avoid bending and twisting.  Also discussed use and acquisition of reacher.       Vision         Perception     Praxis      Pertinent Vitals/Pain Pain Assessment: Faces Faces Pain Scale: Hurts even more Pain Location: back  Pain Descriptors / Indicators: Operative site guarding Pain Intervention(s): Monitored during session;Premedicated before session     Hand Dominance Right   Extremity/Trunk Assessment Upper Extremity Assessment Upper Extremity Assessment: Overall WFL for tasks assessed   Lower Extremity Assessment Lower Extremity Assessment: Defer to PT evaluation       Communication Communication Communication: No difficulties   Cognition Arousal/Alertness:  Awake/alert Behavior During Therapy: WFL for tasks assessed/performed Overall Cognitive Status: Within Functional Limits for tasks assessed                                     General Comments  reviewed precautions and reiterated that he will be on precautions at least 12 weeks and to discuss with MD at follow up appointement      Exercises     Shoulder Instructions      Home Living Family/patient expects to be discharged to:: Private residence Living Arrangements: Spouse/significant other Available Help at Discharge: Family;Available 24 hours/day Type of Home: House Home Access: Ramped entrance     Home Layout: One level     Bathroom Shower/Tub: Chief Strategy Officer: Standard     Home Equipment: Grab bars - tub/shower          Prior Functioning/Environment Level of Independence: Independent        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, drives        OT Problem List: Pain      OT Treatment/Interventions:      OT Goals(Current goals can be found in the care plan section) Acute Rehab OT Goals Patient Stated Goal: to decrease pain and eventually back to work  OT Goal Formulation: All assessment and education complete, DC therapy  OT Frequency:     Barriers to D/C:            Co-evaluation              AM-PAC PT "6 Clicks" Daily Activity     Outcome Measure Help from another person eating meals?: None Help from another person taking care of personal grooming?: A Little Help from another person toileting, which includes using toliet, bedpan, or urinal?: A Little Help from another person bathing (including washing, rinsing, drying)?: A Little Help from another person to put on and taking off regular upper body clothing?: A Little Help from another person to put on and taking off regular lower body clothing?: A Little 6 Click Score: 19   End of Session Equipment Utilized During Treatment: Back brace  Activity Tolerance: Patient tolerated treatment well Patient left: in bed;with call bell/phone within reach  OT Visit Diagnosis: Pain Pain - part of body: (back )                Time: 1610-9604 OT Time Calculation (min): 33 min Charges:  OT General Charges $OT Visit: 1 Visit OT Evaluation $OT Eval Low Complexity: 1 Low OT  Treatments $Self Care/Home Management : 8-22 mins G-Codes:     [employer], OTR/L [phone]   Jeani Hawking M 01/27/2018, 2:40 PM

## 2018-01-27 NOTE — Evaluation (Signed)
Physical Therapy Evaluation Patient Details Name: [name] [name] MRN: [id number] DOB: [birth year] Today's Date: 01/27/2018   History of Present Illness  Patient is a 57 yo male s/p LUMBAR THREE-FOUR, LUMBAR FOUR-FIVE POSTERIOR LUMBAR INTERBODY FUSION  Clinical Impression  Patient seen for mobility assessment s/p spinal surgery. Mobilizing well. Educated patient on precautions, mobility expectations, safety and car transfers. No further acute PT needs. Will sign off.     Follow Up Recommendations No PT follow up    Equipment Recommendations  None recommended by PT    Recommendations for Other Services       Precautions / Restrictions Precautions Required Braces or Orthoses: Spinal Brace Spinal Brace: Lumbar corset      Mobility  Bed Mobility Overal bed mobility: Modified Independent             General bed mobility comments: increased time but no difficulty to perform.  Transfers Overall transfer level: Independent Equipment used: None             General transfer comment: no physical assist, good time to power up  Ambulation/Gait Ambulation/Gait assistance: Independent Ambulation Distance (Feet): 310 Feet Assistive device: None Gait Pattern/deviations: WFL(Within Functional Limits)   Gait velocity interpretation: 1.31 - 2.62 ft/sec, indicative of limited community ambulator General Gait Details: steady with ambulation, no overt LOB  Stairs Stairs: Yes Stairs assistance: Modified independent (Device/Increase time) Stair Management: One rail Right Number of Stairs: 12 General stair comments: no cues or difficulty  Wheelchair Mobility    Modified Rankin (Stroke Patients Only)       Balance Overall balance assessment: Independent                                           Pertinent Vitals/Pain Pain Assessment: Faces Faces Pain Scale: Hurts even more Pain Location: back  Pain Descriptors / Indicators: Operative site  guarding Pain Intervention(s): Monitored during session    Home Living Family/patient expects to be discharged to:: Private residence Living Arrangements: Spouse/significant other Available Help at Discharge: Family;Available 24 hours/day Type of Home: House Home Access: Ramped entrance     Home Layout: One level Home Equipment: Grab bars - tub/shower      Prior Function Level of Independence: Independent         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, Theatre stage manager   Dominant Hand: Right    Extremity/Trunk Assessment   Upper Extremity Assessment Upper Extremity Assessment: Overall WFL for tasks assessed    Lower Extremity Assessment Lower Extremity Assessment: Overall WFL for tasks assessed       Communication   Communication: No difficulties  Cognition Arousal/Alertness: Awake/alert Behavior During Therapy: WFL for tasks assessed/performed Overall Cognitive Status: Within Functional Limits for tasks assessed                                        General Comments      Exercises     Assessment/Plan    PT Assessment Patent does not need any further PT services  PT Problem List         PT Treatment Interventions      PT Goals (Current goals can be found in the Care Plan section)  Acute Rehab PT Goals PT Goal Formulation: All assessment and  education complete, DC therapy    Frequency     Barriers to discharge        Co-evaluation               AM-PAC PT "6 Clicks" Daily Activity  Outcome Measure Difficulty turning over in bed (including adjusting bedclothes, sheets and blankets)?: None Difficulty moving from lying on back to sitting on the side of the bed? : None Difficulty sitting down on and standing up from a chair with arms (e.g., wheelchair, bedside commode, etc,.)?: None Help needed moving to and from a bed to chair (including a wheelchair)?: None Help needed walking in hospital room?: None Help needed  climbing 3-5 steps with a railing? : A Little 6 Click Score: 23    End of Session   Activity Tolerance: Patient tolerated treatment well Patient left: (walking in halls, nsg aware) Nurse Communication: Mobility status PT Visit Diagnosis: Difficulty in walking, not elsewhere classified (R26.2)    Time: 3557-3220 PT Time Calculation (min) (ACUTE ONLY): 16 min   Charges:   PT Evaluation $PT Eval Low Complexity: 1 Low     PT G Codes:        Charlotte Crumb, PT DPT  Board Certified Neurologic Specialist [phone]   Fabio Asa 01/27/2018, 12:16 PM

## 2018-01-28 MED ORDER — METHOCARBAMOL 500 MG PO TABS: 500.0000 mg | ORAL_TABLET | Freq: Four times a day (QID) | ORAL | 0 refills | Status: DC | PRN

## 2018-01-28 MED ORDER — HYDROCODONE-ACETAMINOPHEN 5-325 MG PO TABS: 1.0000 | ORAL_TABLET | ORAL | 0 refills | Status: AC | PRN

## 2018-01-28 NOTE — Progress Notes (Signed)
1900: At bedside report, was advised that pt will be D/C'd tomorrow. Spoke w/ pt at beginning of shift and explained that IV meds need to be stopped at least 12 hrs before discharge. Pt had been receiving IV Dilaudid q 2hrs during day shift. Administered IV med until midnight and then switched to only PO meds.

## 2018-01-28 NOTE — Discharge Summary (Signed)
Physician Discharge Summary  Patient ID: DAT DERKSEN MRN: [id number] DOB/AGE: Oct 01, 1960 57 y.o.  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 4-5. The patient tolerated the procedure well and was taken to the recovery room and then to the floor in stable condition. The hospital course was routine. There were no complications. The wound remained clean dry and intact. Pt had appropriate back soreness. No complaints of leg pain or new N/T/W. The patient remained afebrile with stable vital signs, and tolerated a regular diet. The patient continued to increase activities, and pain was well controlled with oral pain medications.   Consults: None  Significant Diagnostic Studies:  Results for orders placed or performed during the hospital encounter of 01/26/18  Protime-INR  Result Value Ref Range   Prothrombin Time 14.3 11.4 - 15.2 seconds   INR 1.12     Dg Lumbar Spine 2-3 Views  Result Date: 01/26/2018 CLINICAL DATA:  L3-4 and L4-5 PLIF EXAM: LUMBAR SPINE - 2-3 VIEW; DG C-ARM 61-120 MIN COMPARISON:  None. FINDINGS: Two intraoperative views during L3-4 and L4-5 PLIF procedure are provided without immediate complications noted. Hardware is in place spanning L3 through L5 with interbody blocks noted. A total of 1:08 minutes of fluoroscopic time was utilized. IMPRESSION: Fluoroscopic time utilized for L3-4 and L4-5 PLIF procedure. Two views of the lower lumbar spine demonstrate hardware place without immediate complications. Electronically Signed   By: Tollie Eth M.D.   On: 01/26/2018 19:49   Ir Ivc Filter Plmt / S&i /img Guid/mod Sed  Result Date:  01/19/2018 INDICATION: History postoperative DVT and pulmonary embolism necessitating bilateral pulmonary arterial catheter directed thrombolysis on 09/17/2017. Patient is now scheduled to undergo repeat back surgery and as such, request made for placement of an IVC filter for the purposes of temporary caval interruption. EXAM: ULTRASOUND GUIDANCE FOR VASCULAR ACCESS IVC CATHETERIZATION AND VENOGRAM IVC FILTER INSERTION COMPARISON:  CT abdomen and pelvis - 09/19/2017 MEDICATIONS: None. ANESTHESIA/SEDATION: Fentanyl 100 mcg IV; Versed 2 mg IV Sedation Time: 11 minutes; The patient was continuously monitored during the procedure by the interventional radiology nurse under my direct supervision. CONTRAST:  45 cc Isovue-300 FLUOROSCOPY TIME:  54 seconds (154 mGy) COMPLICATIONS: None immediate PROCEDURE: Informed consent was obtained from the patient following explanation of the procedure, risks, benefits and alternatives. The patient understands, agrees and consents for the procedure. All questions were addressed. A time out was performed prior to the initiation of the procedure. Maximal barrier sterile technique utilized including caps, mask, sterile gowns, sterile gloves, large sterile drape, hand hygiene, and Betadine prep. Under sterile condition and local anesthesia, right internal jugular venous access was performed with ultrasound. An ultrasound image was saved and sent to PACS. Over a guidewire, the IVC filter delivery sheath and inner dilator were advanced into the IVC just above the IVC bifurcation. Contrast injection was performed for an IVC venogram. Through the delivery sheath, a retrievable Denali IVC filter was deployed below the level of the renal veins and above the IVC bifurcation. Limited post deployment venacavagram was performed. The delivery sheath was removed and hemostasis was obtained with manual compression. A dressing was placed. The patient tolerated the procedure well without immediate post  procedural complication. FINDINGS: The IVC is patent.  No evidence of thrombus, stenosis, or occlusion. No variant venous anatomy. Successful placement of the IVC filter below the level of the renal veins. IMPRESSION: Successful ultrasound and fluoroscopically guided placement of an infrarenal retrievable IVC filter via right jugular approach. PLAN: This IVC filter is potentially retrievable. The patient will be assessed for filter retrieval by Interventional Radiology in approximately 8-12 weeks. Further recommendations regarding filter retrieval, continued surveillance or declaration of device permanence, will be made at that time. Electronically Signed   By: Simonne Come M.D.   On: 01/19/2018 10:58   Dg C-arm 1-60 Min  Result Date: 01/26/2018 CLINICAL DATA:  L3-4 and L4-5 PLIF EXAM: LUMBAR SPINE - 2-3 VIEW; DG C-ARM 61-120 MIN COMPARISON:  None. FINDINGS: Two intraoperative views during L3-4 and L4-5 PLIF procedure are provided without immediate complications noted. Hardware is in place spanning L3 through L5 with interbody blocks noted. A total of 1:08 minutes of fluoroscopic time was utilized. IMPRESSION: Fluoroscopic time utilized for L3-4 and L4-5 PLIF procedure. Two views of the lower lumbar spine demonstrate hardware place without immediate complications. Electronically Signed   By: Tollie Eth M.D.   On: 01/26/2018 19:49   Dg C-arm 1-60 Min  Result Date: 01/26/2018 CLINICAL DATA:  L3-4 and L4-5 PLIF EXAM: LUMBAR SPINE - 2-3 VIEW; DG C-ARM 61-120 MIN COMPARISON:  None. FINDINGS: Two intraoperative views during L3-4 and L4-5 PLIF procedure are provided without immediate complications noted. Hardware is in place spanning L3 through L5 with interbody blocks noted. A total of 1:08 minutes of fluoroscopic time was utilized. IMPRESSION: Fluoroscopic time utilized for L3-4 and L4-5 PLIF procedure. Two views of the lower lumbar spine demonstrate hardware place without immediate complications. Electronically  Signed   By: Tollie Eth M.D.   On: 01/26/2018 19:49   Ir Radiologist Eval & Mgmt  Result Date: 01/19/2018 Please refer to notes tab for details about interventional procedure. (Op Note)   Antibiotics:  Anti-infectives (From admission, onward)   Start     Dose/Rate Route Frequency Ordered Stop   01/27/18 0200  ceFAZolin (ANCEF) IVPB 2g/100 mL premix     2 g 200 mL/hr over 30 Minutes Intravenous Every 8 hours 01/26/18 2105 01/27/18 0940   01/26/18 1618  bacitracin 50,000 Units in sodium chloride 0.9 % 500 mL irrigation  Status:  Discontinued       As needed 01/26/18 1618 01/26/18 1921   01/26/18 1037  ceFAZolin (ANCEF) 2-4 GM/100ML-% IVPB    Note to Pharmacy:  Aquilla Hacker   : cabinet override      01/26/18 1037 01/26/18 1410   01/26/18 1030  ceFAZolin (ANCEF) IVPB 2g/100 mL premix     2 g 200 mL/hr over 30 Minutes Intravenous On call to O.R. 01/26/18 1029 01/26/18 1823      Discharge Exam: Blood pressure 119/80, pulse 80, temperature 98.6 F (37 C), temperature source Oral, resp. rate 18, height  (1.88 m), weight 237 lb 3.4 oz (107.6 kg), SpO2 99 %. Neurologic: Grossly normal Ambulating and voiding well, incision CDI  Discharge Medications:   Allergies as of 01/28/2018      Reactions   Dexamethasone Other (See Comments)   CONFUSION DELIRIOUS "TALKING OUT OF HIS HEAD"   Prednisone Other (See Comments)   CONFUSION DELIRIOUS "TALKING OUT OF HIS HEAD"      Medication List    STOP taking these medications   rivaroxaban 20 MG Tabs tablet Commonly known as:  XARELTO     TAKE these medications  amLODipine 10 MG tablet Commonly known as:  NORVASC Take 10 mg by mouth daily.   CINNAMON PO Take 1 capsule by mouth daily.   HYDROcodone-acetaminophen 5-325 MG tablet Commonly known as:  NORCO/VICODIN Take 1-2 tablets by mouth every 4 (four) hours as needed for moderate pain.   methocarbamol 500 MG tablet Commonly known as:  ROBAXIN Take 1 tablet (500 mg total) by  mouth every 6 (six) hours as needed for muscle spasms. What changed:  Another medication with the same name was added. Make sure you understand how and when to take each.   methocarbamol 500 MG tablet Commonly known as:  ROBAXIN Take 1 tablet (500 mg total) by mouth every 6 (six) hours as needed for muscle spasms. What changed:  You were already taking a medication with the same name, and this prescription was added. Make sure you understand how and when to take each.   oxyCODONE-acetaminophen 5-325 MG tablet Commonly known as:  PERCOCET/ROXICET Take 1-2 tablets by mouth every 4 (four) hours as needed for severe pain. What changed:  how much to take   zolpidem 10 MG tablet Commonly known as:  AMBIEN Take 1 tablet (10 mg total) by mouth at bedtime as needed for sleep.            Durable Medical Equipment  (From admission, onward)        Start     Ordered   01/26/18 2106  DME Walker rolling  Once    Question:  Patient needs a walker to treat with the following condition  Answer:  S/P lumbar fusion   01/26/18 2105   01/26/18 2106  DME 3 n 1  Once     01/26/18 2105      Disposition: home   Final Dx: plif L3-4,4-5  Discharge Instructions    Call MD for:  difficulty breathing, headache or visual disturbances   Complete by:  As directed    Call MD for:  hives   Complete by:  As directed    Call MD for:  persistant dizziness or light-headedness   Complete by:  As directed    Call MD for:  persistant nausea and vomiting   Complete by:  As directed    Call MD for:  redness, tenderness, or signs of infection (pain, swelling, redness, odor or green/yellow discharge around incision site)   Complete by:  As directed    Call MD for:  severe uncontrolled pain   Complete by:  As directed    Call MD for:  temperature >100.4   Complete by:  As directed    Diet - low sodium heart healthy   Complete by:  As directed    Driving Restrictions   Complete by:  As directed    No  driving 2 weeks   Increase activity slowly   Complete by:  As directed    Lifting restrictions   Complete by:  As directed    No lifting more than 8 lbs   Remove dressing in 24 hours   Complete by:  As directed       Follow-up Information    Tia Alert, MD. Schedule an appointment as soon as possible for a visit in 2 week(s).   Specialty:  Neurosurgery Contact information: [address] [place] [address] [phone]            Signed: Tiana Loft [facility] 01/28/2018, 7:59 AM

## 2018-01-29 MED FILL — Thrombin For Soln 5000 Unit: CUTANEOUS | Qty: 5000 | Status: AC

## 2018-02-20 ENCOUNTER — Telehealth: Payer: Self-pay | Admitting: *Deleted

## 2018-02-20 ENCOUNTER — Other Ambulatory Visit (HOSPITAL_COMMUNITY): Payer: Self-pay | Admitting: Interventional Radiology

## 2018-02-20 DIAGNOSIS — Z95828 Presence of other vascular implants and grafts: Secondary | ICD-10-CM

## 2018-02-20 NOTE — Telephone Encounter (Signed)
lmom for pt to sch f/u from IVC Filter placed 01/19/18 with Dr. Wayland SalinasWatts./vm

## 2018-03-29 ENCOUNTER — Other Ambulatory Visit: Payer: Commercial Managed Care - PPO

## 2018-03-29 ENCOUNTER — Encounter: Payer: Self-pay | Admitting: Radiology

## 2018-04-18 ENCOUNTER — Inpatient Hospital Stay: Admission: RE | Admit: 2018-04-18 | Payer: Commercial Managed Care - PPO | Source: Ambulatory Visit

## 2018-04-18 ENCOUNTER — Other Ambulatory Visit: Payer: Commercial Managed Care - PPO

## 2018-05-15 ENCOUNTER — Other Ambulatory Visit: Payer: Commercial Managed Care - PPO

## 2018-05-16 ENCOUNTER — Encounter: Payer: Self-pay | Admitting: Radiology

## 2018-05-16 NOTE — Progress Notes (Signed)
[name] [name] has been a "No Show" on three dates for follow up ultrasound and office visit with Dr. Simonne Come.  These appointments were to follow up IVC filter status.  The dates missed were:  March 29, 2018; April 18, 2018; and, May 15, 2018.    Per Dr Grace Isaac, a  certified letter has been mailed to inform the patient that our office will not make further attempts to contact him.    Jillaine Waren Carmell Austria, RN 05/16/2018 8:45 AM

## 2018-05-21 ENCOUNTER — Other Ambulatory Visit (HOSPITAL_COMMUNITY): Payer: Self-pay | Admitting: Interventional Radiology

## 2018-05-21 DIAGNOSIS — Z95828 Presence of other vascular implants and grafts: Secondary | ICD-10-CM

## 2018-05-29 ENCOUNTER — Ambulatory Visit
Admission: RE | Admit: 2018-05-29 | Discharge: 2018-05-29 | Disposition: A | Discharge status: Home / Self Care | Payer: Commercial Managed Care - PPO | Source: Ambulatory Visit | Attending: Interventional Radiology | Admitting: Interventional Radiology

## 2018-05-29 ENCOUNTER — Encounter: Payer: Self-pay | Admitting: Radiology

## 2018-05-29 DIAGNOSIS — Z95828 Presence of other vascular implants and grafts: Secondary | ICD-10-CM

## 2018-05-29 HISTORY — PX: IR RADIOLOGIST EVAL & MGMT: IMG5224
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
## 2019-05-04 IMAGING — DX DG SPINE 1V PORT
1 series · 1 of 1 positions shown · non-contrast
Comparison: Prior exam same date.

CLINICAL DATA: Lumbar spine surgery.

EXAM:
PORTABLE SPINE - 1 VIEW

[l-spine lat]
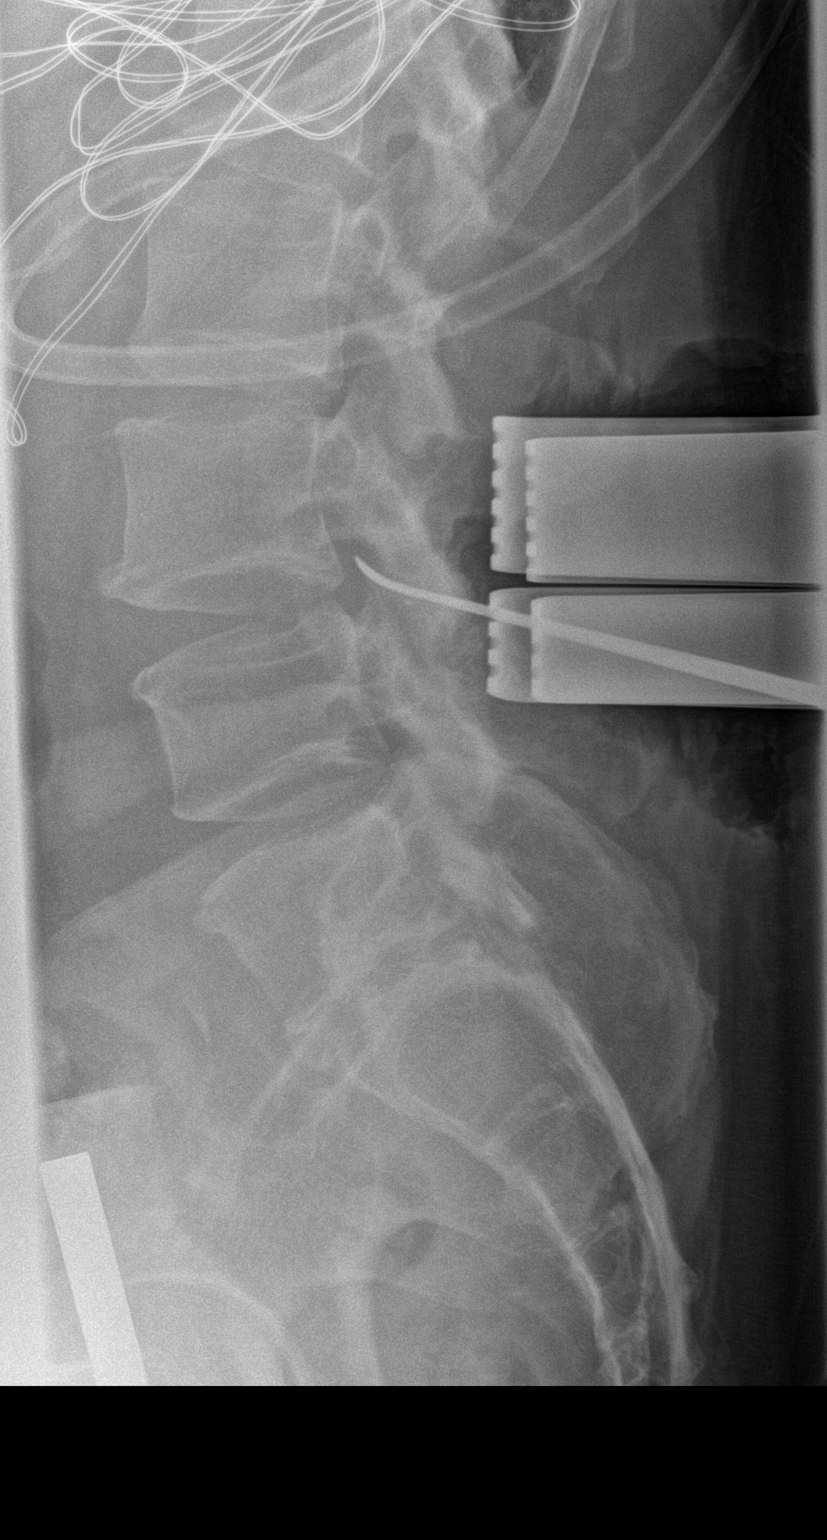

[1 of 1 positions shown; findings below may reference images not displayed]

FINDINGS: Lumbar spine numbered as per prior exam. Metallic marker noted along
the posterior aspect of the inferior endplate of L3.
IMPRESSION: Postsurgical changes lumbar spine as above.

## 2019-05-04 IMAGING — DX DG SPINE 1V PORT
1 series · 1 of 1 positions shown · non-contrast
Comparison: Lumbar myelogram 08/09/2017. Lumbar radiography
09/01/2017

CLINICAL DATA: Numbering for spinal decompression

EXAM:
PORTABLE SPINE - 1 VIEW

[l-spine lat]
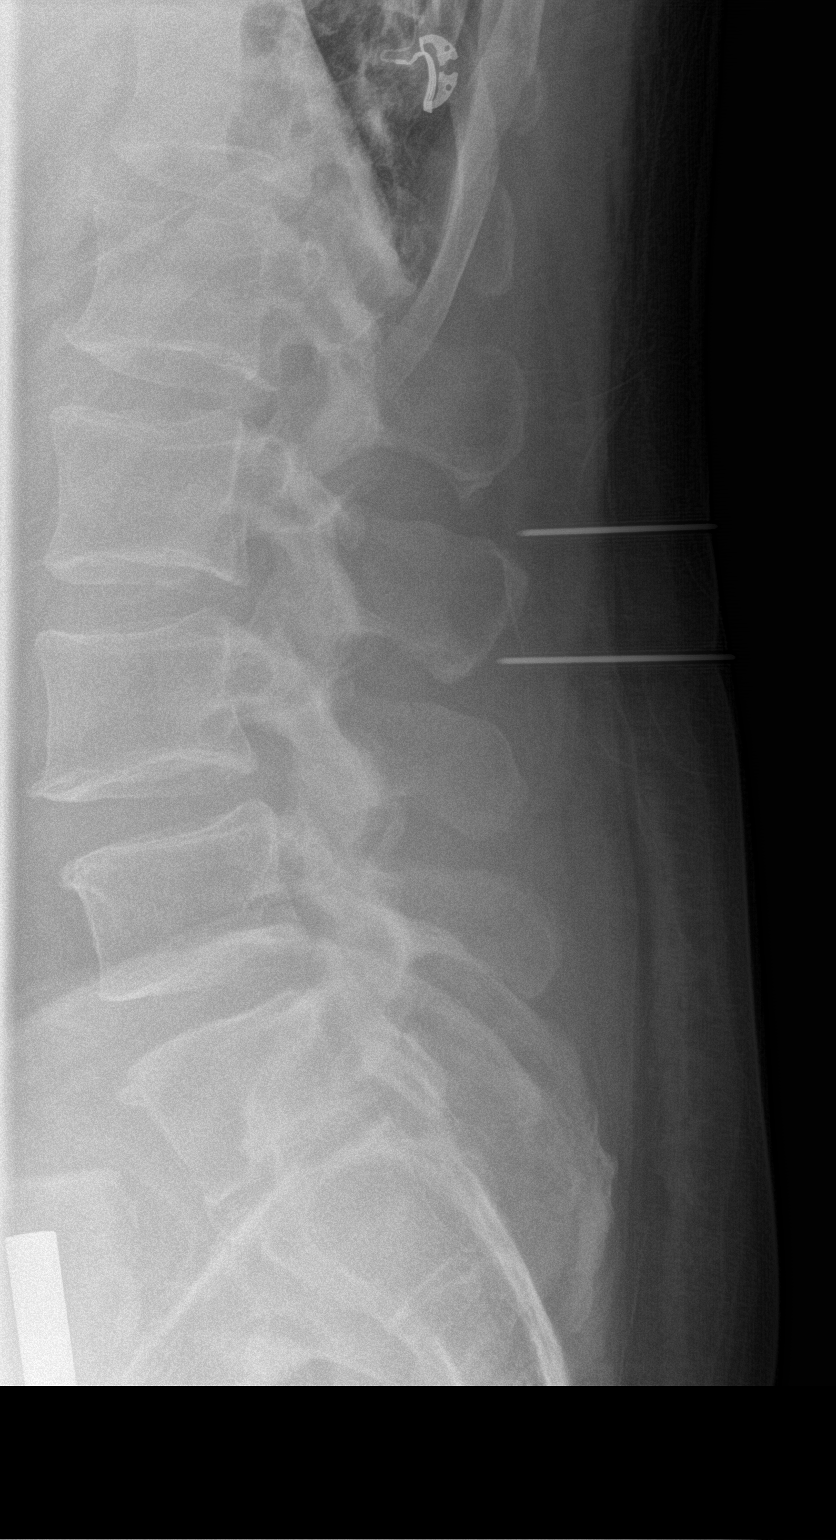

[1 of 1 positions shown; findings below may reference images not displayed]

FINDINGS: Spinous processes were numbered as on preoperative myelogram and
radiography. Spinal needles bracket the L2 spinous process. No new
osseous finding.
IMPRESSION: Spinal needles bracket the L2 spinous process.

## 2020-06-25 IMAGING — US US EXTREM LOW VENOUS BILAT
1 series · 12 of 24 positions shown · non-contrast
Comparison: None.

CLINICAL DATA: History postoperative DVT and pulmonary embolism
necessitating bilateral pulmonary arterial catheter directed
thrombolysis on 09/17/2017 (at [REDACTED]).
TECHNIQUE: Gray-scale sonography with graded compression, as well as color
Doppler and duplex ultrasound were performed to evaluate the lower
extremity deep venous systems from the level of the common femoral
vein and including the common femoral, femoral, profunda femoral,
popliteal and calf veins including the posterior tibial, peroneal
and gastrocnemius veins when visible. The superficial great
saphenous vein was also interrogated. Spectral Doppler was utilized
to evaluate flow at rest and with distal augmentation maneuvers in
the common femoral, femoral and popliteal veins.

[Series 1: us extrem low venous bilat · 0.08mm/px · 12 of 66 slices shown]
[im 3/66]
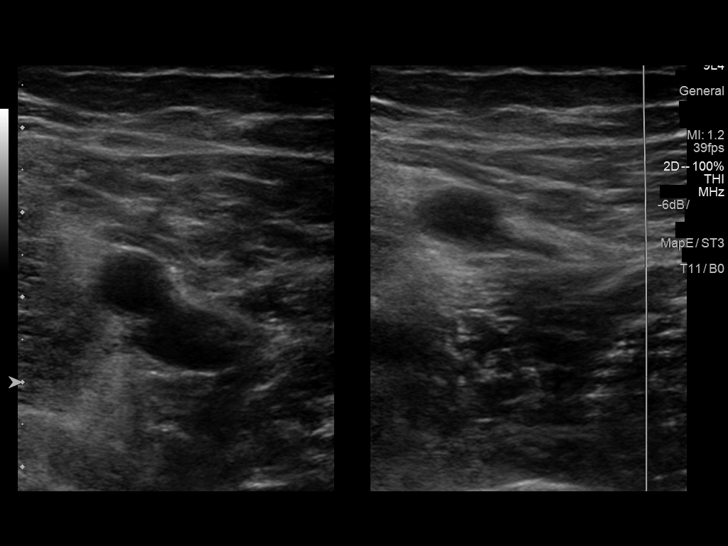
[im 9/66]
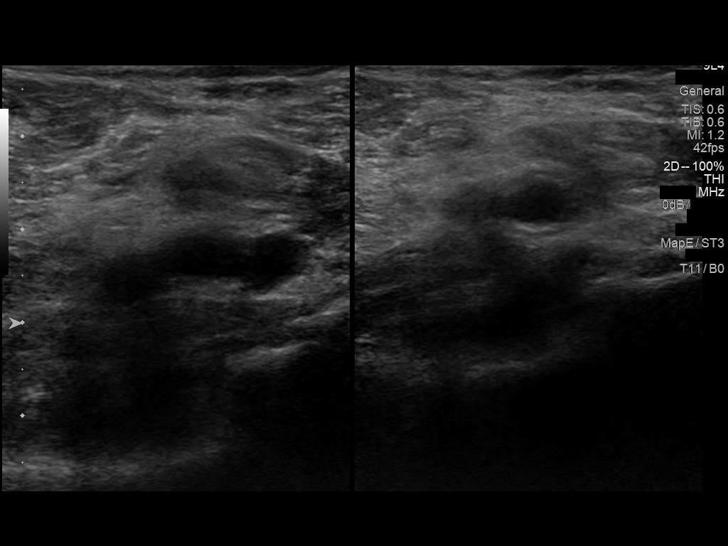
[im 15/66]
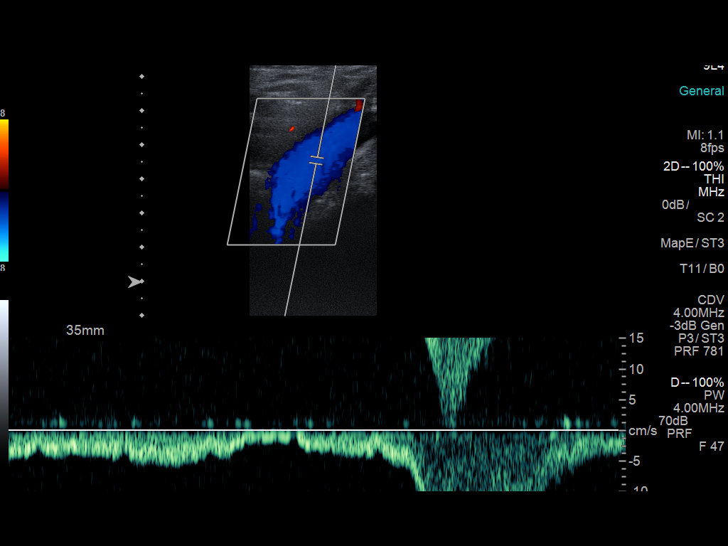
[im 20/66]
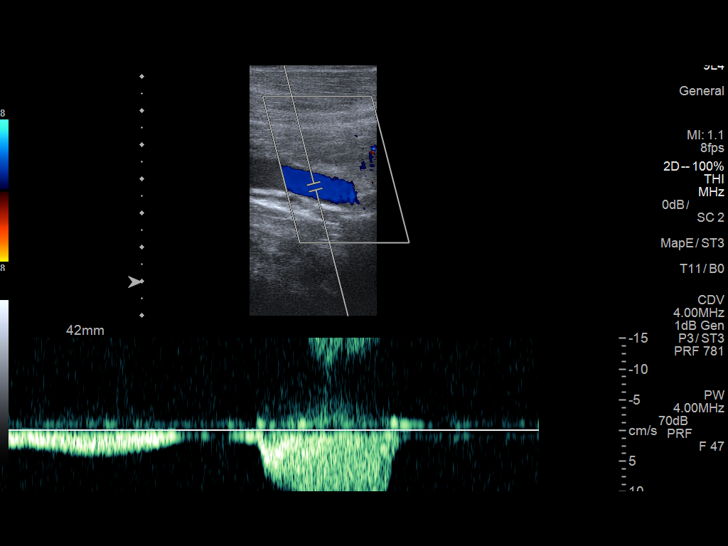
[im 26/66]
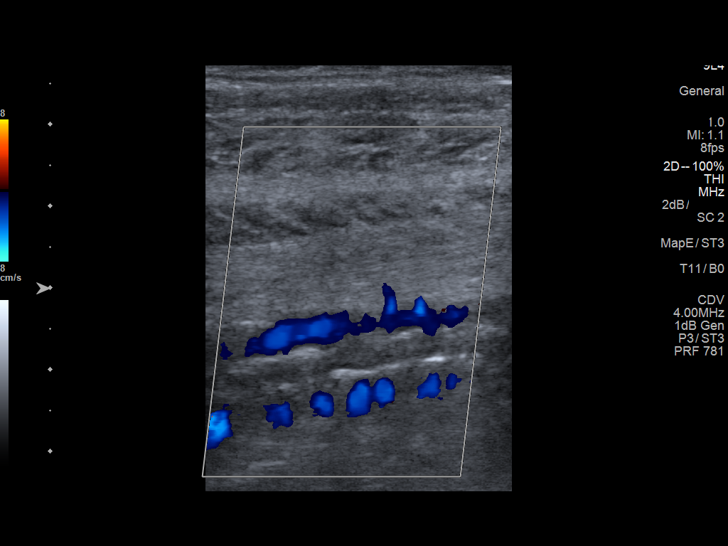
[im 32/66]
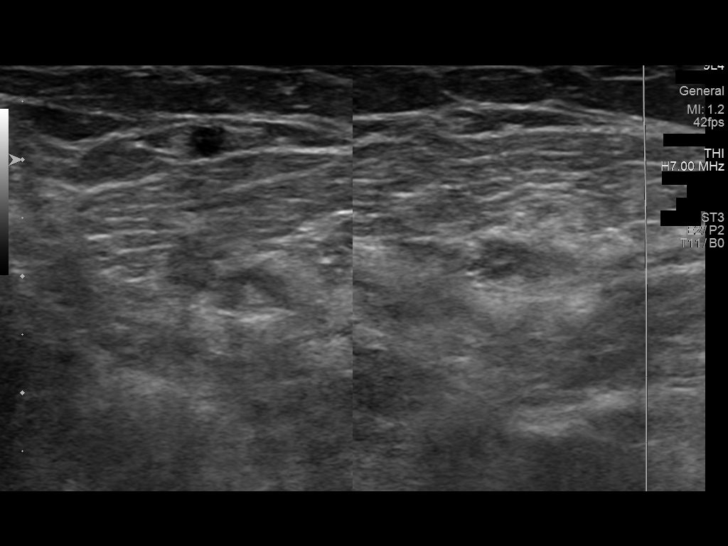
[im 37/66]
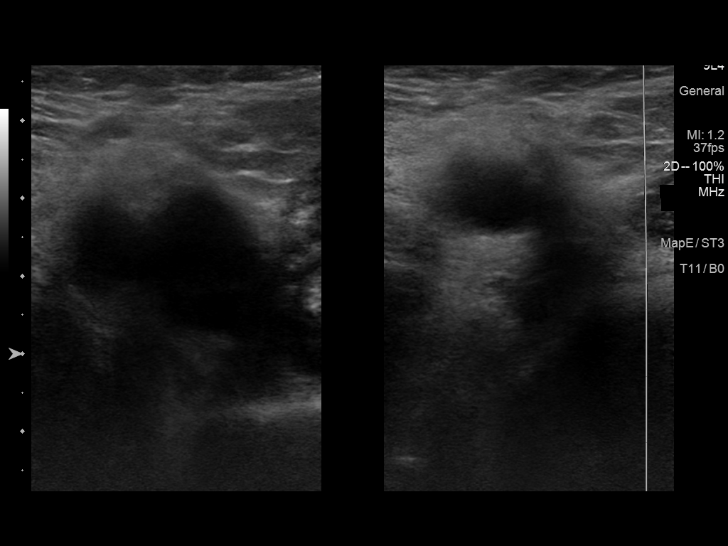
[im 43/66]
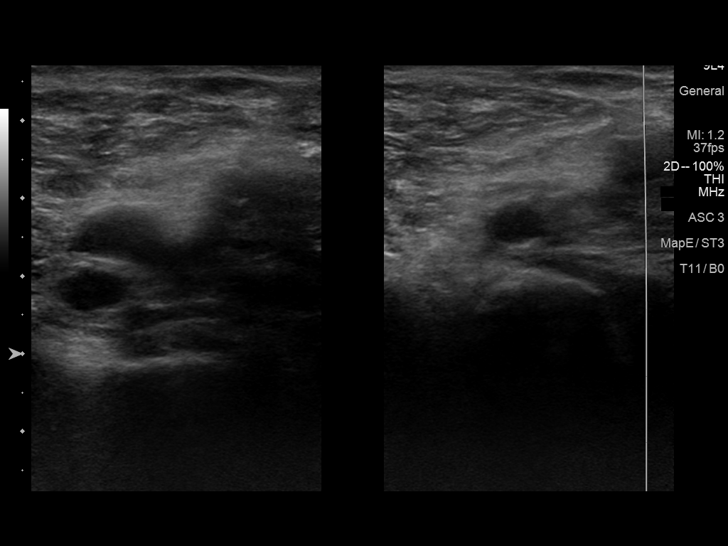
[im 49/66]
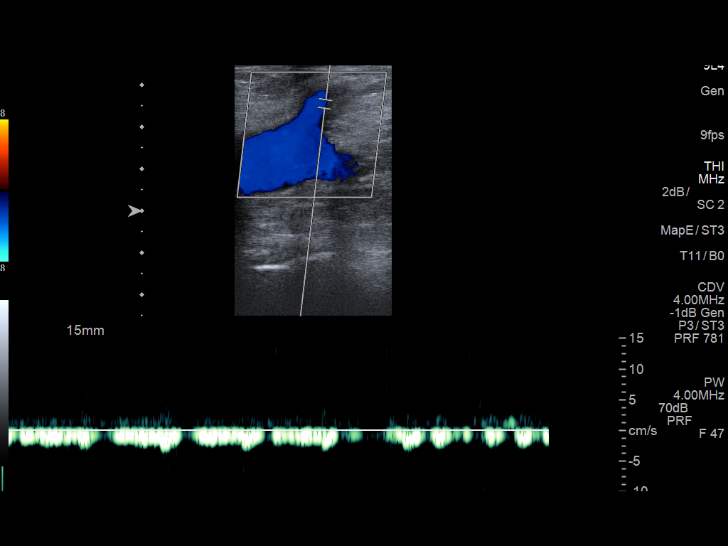
[im 54/66]
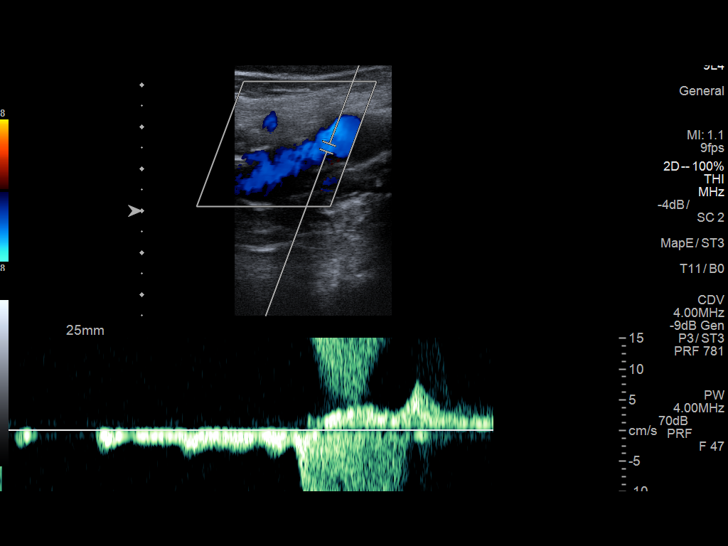
[im 60/66]
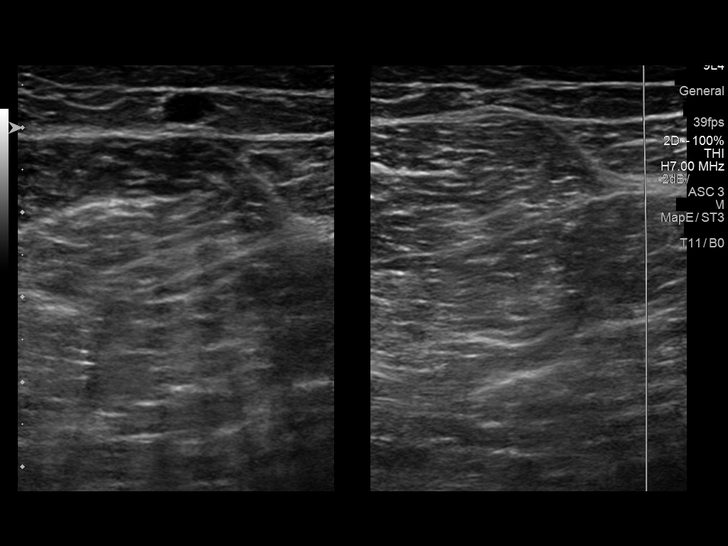
[im 66/66]
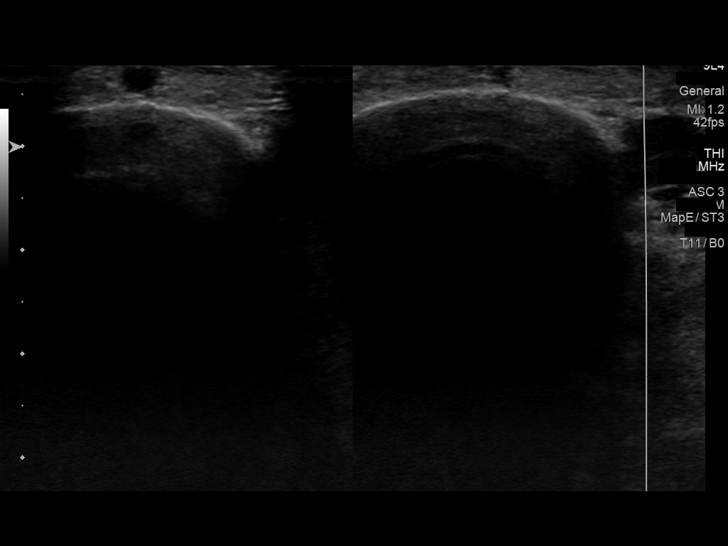

[12 of 24 positions shown; findings below may reference images not displayed]

Request is made for placement of a IVC filter for temporary caval
interruption purposes which was performed on 01/19/2018 prior to
undergoing repeat back surgery.

Patient presents today to the [REDACTED] for
evaluation of IVC filter retrieval.

EXAM:
BILATERAL LOWER EXTREMITY VENOUS DOPPLER ULTRASOUND
FINDINGS: RIGHT LOWER EXTREMITY

Common Femoral Vein: No evidence of thrombus. Normal
compressibility, respiratory phasicity and response to augmentation.

Saphenofemoral Junction: No evidence of thrombus. Normal
compressibility and flow on color Doppler imaging.

Profunda Femoral Vein: No evidence of thrombus. Normal
compressibility and flow on color Doppler imaging.

Femoral Vein: No evidence of thrombus. Normal compressibility,
respiratory phasicity and response to augmentation.

Popliteal Vein: While the popliteal vein appears patent, there is
hypoechoic occlusive presumably chronic DVT within either a
duplicated popliteal vein or a branch of the popliteal vein (image
10).

Calf Veins: No evidence of thrombus. Normal compressibility and flow
on color Doppler imaging.

Superficial Great Saphenous Vein: No evidence of thrombus. Normal
compressibility.

Venous Reflux:  None.

Other Findings:  None.

LEFT LOWER EXTREMITY

Common Femoral Vein: No evidence of thrombus. Normal
compressibility, respiratory phasicity and response to augmentation.

Saphenofemoral Junction: No evidence of thrombus. Normal
compressibility and flow on color Doppler imaging.

Profunda Femoral Vein: No evidence of thrombus. Normal
compressibility and flow on color Doppler imaging.

Femoral Vein: No evidence of thrombus. Normal compressibility,
respiratory phasicity and response to augmentation.

Popliteal Vein: No evidence of thrombus. Normal compressibility,
respiratory phasicity and response to augmentation.

Calf Veins: No evidence of thrombus. Normal compressibility and flow
on color Doppler imaging.

Superficial Great Saphenous Vein: No evidence of thrombus. Normal
compressibility.

Venous Reflux:  None.

Other Findings:  None.
IMPRESSION: 1. No evidence of acute or chronic DVT within the left lower
extremity.
2. No evidence of acute DVT within the right lower extremity.
3. Presumably chronic occlusive DVT within either a duplicated right
popliteal vein or a branch of the right popliteal vein.
